# Patient Record
Sex: Male | Born: 1960 | Race: White | Hispanic: No | Marital: Married | Smoking: Current every day smoker
Health system: Southern US, Community
[De-identification: ages and names within clinical notes are randomized; demographics above are authoritative.]

## PROBLEM LIST (undated history)

## (undated) DIAGNOSIS — I10 Essential (primary) hypertension: Secondary | ICD-10-CM

## (undated) DIAGNOSIS — Z720 Tobacco use: Secondary | ICD-10-CM

## (undated) DIAGNOSIS — F1010 Alcohol abuse, uncomplicated: Secondary | ICD-10-CM

## (undated) DIAGNOSIS — D649 Anemia, unspecified: Secondary | ICD-10-CM

## (undated) HISTORY — PX: WRIST SURGERY: SHX841

---
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
## 2011-05-25 ENCOUNTER — Ambulatory Visit: Payer: Self-pay

## 2014-08-09 ENCOUNTER — Ambulatory Visit: Payer: Self-pay | Admitting: Family Medicine

## 2017-06-03 ENCOUNTER — Other Ambulatory Visit: Payer: Self-pay | Admitting: Orthopedic Surgery

## 2017-06-03 DIAGNOSIS — M5030 Other cervical disc degeneration, unspecified cervical region: Secondary | ICD-10-CM

## 2017-06-03 DIAGNOSIS — M542 Cervicalgia: Secondary | ICD-10-CM

## 2017-06-03 DIAGNOSIS — M5412 Radiculopathy, cervical region: Secondary | ICD-10-CM

## 2017-06-03 DIAGNOSIS — M4312 Spondylolisthesis, cervical region: Secondary | ICD-10-CM

## 2017-06-13 ENCOUNTER — Ambulatory Visit
Admission: RE | Admit: 2017-06-13 | Discharge: 2017-06-13 | Disposition: A | Discharge status: Home / Self Care | Payer: PRIVATE HEALTH INSURANCE | Source: Ambulatory Visit | Attending: Orthopedic Surgery | Admitting: Orthopedic Surgery

## 2017-06-13 DIAGNOSIS — M542 Cervicalgia: Secondary | ICD-10-CM

## 2017-06-13 DIAGNOSIS — M5030 Other cervical disc degeneration, unspecified cervical region: Secondary | ICD-10-CM

## 2017-06-13 DIAGNOSIS — M5412 Radiculopathy, cervical region: Secondary | ICD-10-CM

## 2017-06-13 DIAGNOSIS — M4312 Spondylolisthesis, cervical region: Secondary | ICD-10-CM

## 2017-06-27 ENCOUNTER — Ambulatory Visit: Admission: RE | Admit: 2017-06-27 | Payer: PRIVATE HEALTH INSURANCE | Source: Ambulatory Visit

## 2017-07-04 ENCOUNTER — Ambulatory Visit
Admission: RE | Admit: 2017-07-04 | Discharge: 2017-07-04 | Disposition: A | Discharge status: Home / Self Care | Payer: PRIVATE HEALTH INSURANCE | Source: Ambulatory Visit | Attending: Orthopedic Surgery | Admitting: Orthopedic Surgery

## 2017-07-04 DIAGNOSIS — M4312 Spondylolisthesis, cervical region: Secondary | ICD-10-CM | POA: Insufficient documentation

## 2017-07-04 DIAGNOSIS — M2578 Osteophyte, vertebrae: Secondary | ICD-10-CM | POA: Insufficient documentation

## 2017-07-04 DIAGNOSIS — M50122 Cervical disc disorder at C5-C6 level with radiculopathy: Secondary | ICD-10-CM | POA: Insufficient documentation

## 2017-07-04 DIAGNOSIS — M542 Cervicalgia: Secondary | ICD-10-CM | POA: Diagnosis present

## 2017-07-04 DIAGNOSIS — M5030 Other cervical disc degeneration, unspecified cervical region: Secondary | ICD-10-CM

## 2017-07-04 DIAGNOSIS — M4802 Spinal stenosis, cervical region: Secondary | ICD-10-CM | POA: Insufficient documentation

## 2017-07-04 DIAGNOSIS — M5412 Radiculopathy, cervical region: Secondary | ICD-10-CM

## 2018-01-12 DIAGNOSIS — F1010 Alcohol abuse, uncomplicated: Secondary | ICD-10-CM

## 2018-01-12 DIAGNOSIS — Z720 Tobacco use: Secondary | ICD-10-CM

## 2018-01-12 HISTORY — DX: Tobacco use: Z72.0

## 2018-01-12 HISTORY — DX: Alcohol abuse, uncomplicated: F10.10

## 2018-01-27 ENCOUNTER — Other Ambulatory Visit: Payer: PRIVATE HEALTH INSURANCE

## 2018-01-30 ENCOUNTER — Other Ambulatory Visit: Payer: Self-pay

## 2018-01-30 ENCOUNTER — Encounter
Admission: RE | Admit: 2018-01-30 | Discharge: 2018-01-30 | Disposition: A | Discharge status: Home / Self Care | Payer: PRIVATE HEALTH INSURANCE | Source: Ambulatory Visit | Attending: Neurosurgery | Admitting: Neurosurgery

## 2018-01-30 ENCOUNTER — Inpatient Hospital Stay: Admission: RE | Admit: 2018-01-30 | Payer: PRIVATE HEALTH INSURANCE | Source: Ambulatory Visit

## 2018-01-30 DIAGNOSIS — K219 Gastro-esophageal reflux disease without esophagitis: Secondary | ICD-10-CM | POA: Diagnosis not present

## 2018-01-30 DIAGNOSIS — G5601 Carpal tunnel syndrome, right upper limb: Secondary | ICD-10-CM | POA: Diagnosis not present

## 2018-01-30 DIAGNOSIS — Z5309 Procedure and treatment not carried out because of other contraindication: Secondary | ICD-10-CM | POA: Diagnosis not present

## 2018-01-30 DIAGNOSIS — Z87891 Personal history of nicotine dependence: Secondary | ICD-10-CM | POA: Diagnosis not present

## 2018-01-30 DIAGNOSIS — E785 Hyperlipidemia, unspecified: Secondary | ICD-10-CM | POA: Diagnosis not present

## 2018-01-30 DIAGNOSIS — I10 Essential (primary) hypertension: Secondary | ICD-10-CM | POA: Diagnosis not present

## 2018-01-30 HISTORY — DX: Anemia, unspecified: D64.9

## 2018-01-30 HISTORY — DX: Essential (primary) hypertension: I10

## 2018-01-30 HISTORY — DX: Alcohol abuse, uncomplicated: F10.10

## 2018-01-30 HISTORY — DX: Tobacco use: Z72.0

## 2018-01-30 LAB — CBC
HEMATOCRIT: 46.3 % (ref 40.0–52.0)
HEMOGLOBIN: 16.2 g/dL (ref 13.0–18.0)
MCH: 34.8 pg — ABNORMAL HIGH (ref 26.0–34.0)
MCHC: 34.9 g/dL (ref 32.0–36.0)
MCV: 99.6 fL (ref 80.0–100.0)
Platelets: 190 10*3/uL (ref 150–440)
RBC: 4.65 MIL/uL (ref 4.40–5.90)
RDW: 16.1 % — ABNORMAL HIGH (ref 11.5–14.5)
WBC: 5.5 10*3/uL (ref 3.8–10.6)

## 2018-01-30 LAB — APTT: aPTT: 30 seconds (ref 24–36)

## 2018-01-30 LAB — BASIC METABOLIC PANEL
ANION GAP: 12 (ref 5–15)
BUN: 11 mg/dL (ref 6–20)
CHLORIDE: 96 mmol/L — AB (ref 101–111)
CO2: 28 mmol/L (ref 22–32)
Calcium: 9.6 mg/dL (ref 8.9–10.3)
Creatinine, Ser: 0.88 mg/dL (ref 0.61–1.24)
GFR calc non Af Amer: >60 mL/min (ref >60)
GLUCOSE: 111 mg/dL — AB (ref 65–99)
POTASSIUM: 2.9 mmol/L — AB (ref 3.5–5.1)
Sodium: 136 mmol/L (ref 135–145)

## 2018-01-30 LAB — PROTIME-INR
INR: 0.98
Prothrombin Time: 12.9 seconds (ref 11.4–15.2)

## 2018-01-30 NOTE — Pre-Procedure Instructions (Signed)
Met B results sent to Dr. Lacinda Axon for review.  Advised Dr. Lacinda Axon that potassium will be checked D.O.S. 02/02/18.

## 2018-01-30 NOTE — Patient Instructions (Signed)
Your procedure is scheduled on: Monday, April 22nd  Report to THE SECOND FLOOR OF THE MEDICAL MALL.  DO NOT STOP ON THE FIRST FLOOR.  PLEASE ARRIVE IN PRE-OP AT 6:00 AM!!  Remember: Instructions that are not followed completely may result in serious  medical risk, up to and including death, or upon the discretion of your surgeon  and anesthesiologist your surgery may need to be rescheduled.     _X__ 1. Do not eat food after midnight the night before your procedure.                 No gum chewing or hard candies.                   You may drink clear liquids up to 2 hours before you are scheduled to arrive                  for your surgery-                      DO not drink clear liquids within 2 hours of the start of your surgery.                  Clear Liquids include:  water, apple juice without pulp, clear carbohydrate                 drink such as Clearfast of Gartorade, Black Coffee or Tea (Do not add                 anything to coffee or tea).  __X__2.  On the morning of surgery brush your teeth with toothpaste and water,                     you may rinse your mouth with mouthwash if you wish.                           Do not swallow any toothpaste of mouthwash.     _X__ 3.  No Alcohol for 24 hours before or after surgery.   _X__ 4.  Do Not Smoke or use e-cigarettes For 24 Hours Prior to Your Surgery.                 Do not use any chewable tobacco products for at least 6 hours prior to                 surgery.  ____  5.  Bring all medications with you on the day of surgery if instructed.   ____  6.  Notify your doctor if there is any change in your medical condition      (cold, fever, infections).     Do not wear jewelry, make-up, hairpins, clips or nail polish. Do not wear lotions, powders, or perfumes. You may wear deodorant. Do not shave 48 hours prior to surgery. Men may shave face and neck. Do not bring valuables to the hospital.    [facility] is not responsible for any belongings or valuables.  Contacts, dentures or bridgework may not be worn into surgery. Leave your suitcase in the car. After surgery it may be brought to your room. For patients admitted to the hospital, discharge time is determined by your treatment team.   Patients discharged the day of surgery will not be allowed to drive home.   Please read over the following  fact sheets that you were given:   PREPARING FOR SURGERY  ____ Take these medicines the morning of surgery with A SIP OF WATER:    1. NONE  2.   3.   4.  5.  6.  ____ Fleet Enema (as directed)   _X___ Use CHG Soap as directed  ____ Use inhalers on the day of surgery  __X__ Stop ALL ASPIRIN PRODUCTS TODAY  _X___ Stop Anti-inflammatories TODAY                        YOU  MAY TAKE TYLENOL IF NEEDED   ____ Stop supplements until after surgery.    ____ Bring C-Pap to the hospital.   WEAR A LOOSE FITTING SHIRT SO THAT A HAND BANDAGE WILL FIT.

## 2018-01-30 NOTE — Pre-Procedure Instructions (Signed)
Patient has had several instances of increased bp with diastolic numbers running above 90. Recommended patient check his bp at home on several occasions and keep track of numbers.  If the bp remains elevated with diastolic still in the 90's or above, he should contact his medical doctor.  Also, patient seems to be an appropriate candidate for a sleep study. His wife states that he regularly stops breathing during the night and snores very loudly.

## 2018-02-02 ENCOUNTER — Ambulatory Visit: Payer: PRIVATE HEALTH INSURANCE | Admitting: Certified Registered Nurse Anesthetist

## 2018-02-02 ENCOUNTER — Encounter
Admission: RE | Disposition: A | Discharge status: Home / Self Care | Payer: Self-pay | Source: Ambulatory Visit | Attending: Neurosurgery

## 2018-02-02 ENCOUNTER — Ambulatory Visit
Admission: RE | Admit: 2018-02-02 | Discharge: 2018-02-02 | Disposition: A | Discharge status: Home / Self Care | Payer: PRIVATE HEALTH INSURANCE | Source: Ambulatory Visit | Attending: Neurosurgery | Admitting: Neurosurgery

## 2018-02-02 DIAGNOSIS — K219 Gastro-esophageal reflux disease without esophagitis: Secondary | ICD-10-CM | POA: Insufficient documentation

## 2018-02-02 DIAGNOSIS — Z5309 Procedure and treatment not carried out because of other contraindication: Secondary | ICD-10-CM | POA: Insufficient documentation

## 2018-02-02 DIAGNOSIS — G5601 Carpal tunnel syndrome, right upper limb: Secondary | ICD-10-CM | POA: Diagnosis not present

## 2018-02-02 DIAGNOSIS — E785 Hyperlipidemia, unspecified: Secondary | ICD-10-CM | POA: Insufficient documentation

## 2018-02-02 DIAGNOSIS — Z87891 Personal history of nicotine dependence: Secondary | ICD-10-CM | POA: Insufficient documentation

## 2018-02-02 DIAGNOSIS — I10 Essential (primary) hypertension: Secondary | ICD-10-CM | POA: Insufficient documentation

## 2018-02-02 LAB — POCT I-STAT 4, (NA,K, GLUC, HGB,HCT)
Glucose, Bld: 127 mg/dL — ABNORMAL HIGH (ref 65–99)
HEMATOCRIT: 47 % (ref 39.0–52.0)
HEMOGLOBIN: 16 g/dL (ref 13.0–17.0)
Potassium: 2.7 mmol/L — CL (ref 3.5–5.1)
SODIUM: 132 mmol/L — AB (ref 135–145)

## 2018-02-02 SURGERY — CARPAL TUNNEL RELEASE
Anesthesia: Choice | Laterality: Right

## 2018-02-02 MED ORDER — FAMOTIDINE 20 MG PO TABS
ORAL_TABLET | ORAL | Status: AC
  Filled 2018-02-02: qty 1

## 2018-02-02 MED ORDER — LIDOCAINE HCL (PF) 2 % IJ SOLN
INTRAMUSCULAR | Status: AC
  Filled 2018-02-02: qty 10

## 2018-02-02 MED ORDER — ONDANSETRON HCL 4 MG/2ML IJ SOLN
INTRAMUSCULAR | Status: AC
  Filled 2018-02-02: qty 2

## 2018-02-02 MED ORDER — PROPOFOL 10 MG/ML IV BOLUS
INTRAVENOUS | Status: AC
  Filled 2018-02-02: qty 20

## 2018-02-02 MED ORDER — FENTANYL CITRATE (PF) 100 MCG/2ML IJ SOLN
INTRAMUSCULAR | Status: AC
  Filled 2018-02-02: qty 2

## 2018-02-02 MED ORDER — FAMOTIDINE 20 MG PO TABS
20.0000 mg | ORAL_TABLET | Freq: Once | ORAL | Status: AC
  Administered 2018-02-02: 20 mg via ORAL

## 2018-02-02 MED ORDER — SEVOFLURANE IN SOLN
RESPIRATORY_TRACT | Status: AC
  Filled 2018-02-02: qty 250

## 2018-02-02 MED ORDER — PHENYLEPHRINE HCL 10 MG/ML IJ SOLN
INTRAMUSCULAR | Status: AC
  Filled 2018-02-02: qty 1

## 2018-02-02 MED ORDER — DEXAMETHASONE SODIUM PHOSPHATE 10 MG/ML IJ SOLN
INTRAMUSCULAR | Status: AC
  Filled 2018-02-02: qty 1

## 2018-02-02 MED ORDER — EPHEDRINE SULFATE 50 MG/ML IJ SOLN
INTRAMUSCULAR | Status: AC
  Filled 2018-02-02: qty 1

## 2018-02-02 MED ORDER — LACTATED RINGERS IV SOLN
INTRAVENOUS | Status: DC
  Administered 2018-02-02: 07:00:00 via INTRAVENOUS

## 2018-02-02 MED ORDER — CEFAZOLIN SODIUM-DEXTROSE 2-3 GM-%(50ML) IV SOLR
INTRAVENOUS | Status: AC
  Filled 2018-02-02: qty 50

## 2018-02-02 MED ORDER — MIDAZOLAM HCL 2 MG/2ML IJ SOLN
INTRAMUSCULAR | Status: AC
  Filled 2018-02-02: qty 2

## 2018-02-02 MED ORDER — FAMOTIDINE 20 MG PO TABS
ORAL_TABLET | ORAL | Status: AC
  Administered 2018-02-02: 20 mg via ORAL
  Filled 2018-02-02: qty 1

## 2018-02-02 MED ORDER — DEXTROSE 5 % IV SOLN: 2.0000 g | INTRAVENOUS | Status: DC

## 2018-02-02 SURGICAL SUPPLY — 28 items
BNDG GAUZE 4.5X4.1 6PLY STRL (MISCELLANEOUS) ×3 IMPLANT
CANISTER SUCT 1200ML W/VALVE (MISCELLANEOUS) ×3 IMPLANT
CHLORAPREP W/TINT 26ML (MISCELLANEOUS) ×6 IMPLANT
CORD BIP STRL DISP 12FT (MISCELLANEOUS) ×3 IMPLANT
DERMABOND ADVANCED (GAUZE/BANDAGES/DRESSINGS)
DERMABOND ADVANCED .7 DNX12 (GAUZE/BANDAGES/DRESSINGS) IMPLANT
ELECT CAUTERY BLADE TIP 2.5 (TIP) ×3
ELECTRODE CAUTERY BLDE TIP 2.5 (TIP) ×1 IMPLANT
FORCEPS JEWEL BIP 4-3/4 STR (INSTRUMENTS) ×3 IMPLANT
GAUZE PETRO XEROFOAM 1X8 (MISCELLANEOUS) ×3 IMPLANT
GAUZE SPONGE 4X4 12PLY STRL (GAUZE/BANDAGES/DRESSINGS) ×6 IMPLANT
GLOVE BIOGEL PI IND STRL 8 (GLOVE) ×1 IMPLANT
GLOVE BIOGEL PI INDICATOR 8 (GLOVE) ×2
GLOVE SURG SYN 8.0 (GLOVE) ×3 IMPLANT
GOWN STRL REUS W/ TWL LRG LVL3 (GOWN DISPOSABLE) ×2 IMPLANT
GOWN STRL REUS W/ TWL XL LVL3 (GOWN DISPOSABLE) ×1 IMPLANT
GOWN STRL REUS W/TWL LRG LVL3 (GOWN DISPOSABLE) ×4
GOWN STRL REUS W/TWL XL LVL3 (GOWN DISPOSABLE) ×2
KIT TURNOVER KIT A (KITS) ×3 IMPLANT
NS IRRIG 1000ML POUR BTL (IV SOLUTION) ×3 IMPLANT
PACK EXTREMITY ARMC (MISCELLANEOUS) ×3 IMPLANT
STOCKINETTE STRL 4IN 9604848 (GAUZE/BANDAGES/DRESSINGS) ×3 IMPLANT
SUT ETHILON 3-0 FS-10 30 BLK (SUTURE) ×3
SUT VIC AB 2-0 SH 27 (SUTURE) ×6
SUT VIC AB 2-0 SH 27XBRD (SUTURE) ×3 IMPLANT
SUT VIC AB 3-0 SH 27 (SUTURE) ×2
SUT VIC AB 3-0 SH 27X BRD (SUTURE) ×1 IMPLANT
SUTURE EHLN 3-0 FS-10 30 BLK (SUTURE) ×1 IMPLANT

## 2018-02-02 NOTE — OR Nursing (Signed)
Surgery cancelled due to potassium of 2.7. Dr Adriana Simasook and anesthesia aware. Patient to follow up with primary care doctor today.

## 2018-02-02 NOTE — H&P (Signed)
History & Physical   Date of Service: 09/09/2016     Time: 12:32 PM  Chief Complaint: Right hand numbness  History of Present Illness: Mr. Jack Nicholson is here for evaluation of ongoing neck and right shoulder pain. He states this is been going on for over a year. It is constant, aching pain appears more in the musculature on the right side of the neck and into the lateral shoulder. He has had steroid injections in the past that only gave a day to week of relief. He has been tried on prednisone before. He has not been on gabapentin. He does not note any weakness or numbness in his upper arm. He does not note any similar symptoms in the left arm. He has not attempted physical therapy or traction.  He additionally is dealing with some for hand numbness on the right. He does not note any pain but has noted some decreased grip strength on that side. He had a nerve conduction study that did show carpal tunnel syndrome bilaterally. He was given a wrist splint which he is wearing on the right. He has been taking ibuprofen.     Past Medical History:  Diagnosis Date  . GERD (gastroesophageal reflux disease)   . Hyperlipidemia   . Hypertension    Past Surgical History:  Procedure Laterality Date  . cyst removal from spine     "about 50 years ago"  . Hardware removal, excision of osteophyte and debridement of gouty tophus, right index dip joint  Right 07/03/2016   Dr.Poggi   . KNEE ARTHROSCOPY Right 01/2012  . Pin put in right hand index finger     Family History  Problem Relation Age of Onset  . No Known Problems Mother   . No Known Problems Father    Social History   Social History  . Marital status: Single    Spouse name: N/A  . Number of children: N/A  . Years of education: N/A   Social History Main Topics  . Smoking status: Former Smoker    Quit date: 01/12/1955  . Smokeless tobacco: Never Used  . Alcohol use 0.0 oz/week  . Drug use: No  . Sexual activity: Not Asked   Other  Topics Concern  . None   Social History Narrative  . None     No Known Allergies  Medications: Cannot display prior to admission medications because the patient has not been admitted in this contact.    Review of Systems:  General ROS: Negative Psychological ROS: Negative Ophthalmic ROS: Negative ENT ROS: Negative Hematological and Lymphatic ROS: Negative  Endocrine ROS: Negative Respiratory ROS: Negative Cardiovascular ROS: Negative Gastrointestinal ROS: Negative Genito-Urinary ROS: Negative Musculoskeletal ROS: Positive for neck pain Neurological ROS: Positive for right hand numbness Dermatological ROS: Negative   Physical Exam: Temp:  [96.6 F (35.9 C)] 96.6 F (35.9 C) (04/22 0627) Pulse Rate:  [77] 77 (04/22 0627) Resp:  [16] 16 (04/22 0627) BP: (149)/(96) 149/96 (04/22 0627) SpO2:  [99 %] 99 % (04/22 0627) Temp (24hrs), Avg:96.6 F (35.9 C), Min:96.6 F (35.9 C), Max:96.6 F (35.9 C)  Weight: 62.2 kg (137 lb 3.2 oz)  General appearance: Alert, cooperative, in no acute distress Head: Normocephalic, atraumatic Eyes: Normal, EOM intact Oropharynx: Moist without lesions Neck: Neck pain elicited on range of motion exercises, tenderness to palpation of the right paraspinal musculature Pulm: Normal effort, no wheezing CV: Regular rate and rhythm Ext: No edema in LE bilaterally, warm extremities  Neurologic exam:  Mental  status: alertness: alert, affect: normal Speech: fluent and clear Motor: Some giveaway strength noticed in the right upper extremities in all myotomes. At full effort, he is 5 out of 5 strength in bilateral deltoid, tricep, bicep, wrist extension, grip, interossei, APB He is 5 out of 5 strength in bilateral lower extremities Sensory: Decreased to light touch over all fingers of the right hand and palm Reflexes: 2+ and symmetric bilaterally for biceps, 1+ at bilateral patella Gait: normal    Data: EMG This is an abnormal electrodiagnostic  exam consistent with 1)  bilateral moderate (grade III) carpal tunnel syndrome (median  nerve entrapment at wrist). 2) Bilateral sensory ulnar  neuropathies (can't rule out component of generalized peripheral  neuropathy as patient has history of significant alcohol use).  3) Chronic right C5 radiculopathy.    Assessment & Plan:  Plan for right carpal tunnel release  VTE Prophylaxis: SCD  Code Status: Full Code  Discharge Planning: Home  Nathaniel Man, MD 09/09/2016

## 2018-02-02 NOTE — Interval H&P Note (Signed)
History and Physical Interval Note:  02/02/2018 6:40 AM  Tor NettersWalter K Deakins  has presented today for surgery, with the diagnosis of RIGHT CARPAL TUNNEL SYNDROME  The various methods of treatment have been discussed with the patient and family. After consideration of risks, benefits and other options for treatment, the patient has consented to  Procedure(s): CARPAL TUNNEL RELEASE (Right) as a surgical intervention .  The patient's history has been reviewed, patient examined, no change in status, stable for surgery.  I have reviewed the patient's chart and labs.  Questions were answered to the patient's satisfaction.     Lucy ChrisSteven Taym Twist

## 2018-02-02 NOTE — Progress Notes (Signed)
Ancef 2 grams x1 ordered per consult for surgical prophylaxis.

## 2018-02-13 ENCOUNTER — Ambulatory Visit
Admission: EM | Admit: 2018-02-13 | Discharge: 2018-02-13 | Disposition: A | Discharge status: Home / Self Care | Payer: PRIVATE HEALTH INSURANCE | Attending: Emergency Medicine | Admitting: Emergency Medicine

## 2018-02-13 ENCOUNTER — Encounter: Payer: Self-pay | Admitting: Gynecology

## 2018-02-13 DIAGNOSIS — T783XXA Angioneurotic edema, initial encounter: Secondary | ICD-10-CM | POA: Diagnosis not present

## 2018-02-13 MED ORDER — LORATADINE 10 MG PO TABS: 10.0000 mg | ORAL_TABLET | Freq: Every day | ORAL | 0 refills | Status: DC

## 2018-02-13 MED ORDER — EPINEPHRINE 0.3 MG/0.3ML IJ SOAJ: 0.3000 mg | Freq: Once | INTRAMUSCULAR | 1 refills | Status: AC

## 2018-02-13 MED ORDER — PREDNISONE 10 MG (21) PO TBPK: ORAL_TABLET | ORAL | 0 refills | Status: DC

## 2018-02-13 MED ORDER — DEXAMETHASONE SODIUM PHOSPHATE 10 MG/ML IJ SOLN
10.0000 mg | Freq: Once | INTRAMUSCULAR | Status: AC
  Administered 2018-02-13: 10 mg via INTRAMUSCULAR

## 2018-02-13 MED ORDER — FAMOTIDINE 20 MG PO TABS: 20.0000 mg | ORAL_TABLET | Freq: Two times a day (BID) | ORAL | 0 refills | Status: DC

## 2018-02-13 MED ORDER — DIPHENHYDRAMINE HCL 50 MG PO CAPS
50.0000 mg | ORAL_CAPSULE | Freq: Once | ORAL | Status: AC
  Administered 2018-02-13: 50 mg via ORAL

## 2018-02-13 NOTE — Discharge Instructions (Signed)
If your symptoms return or get worse, use the EpiPen, take 50 mg of Benadryl, go immediately to the ER.  Talk to your doctor about the lip swelling.  This could be from your lisinopril.

## 2018-02-13 NOTE — ED Triage Notes (Signed)
Per patient had surgery on his right wrist x yesterday at [facility]. Per patient return home at 10:30 am. Patient stated that after getting home he had a couple of cigarettes then suddenly his lips and eyes swollen .

## 2018-02-13 NOTE — ED Provider Notes (Signed)
HPI  SUBJECTIVE:  Jack Nicholson is a 57 y.o. male who presents with bilateral lip swelling starting last night.  Patient states that he had general anesthesia for surgery yesterday but recovered uneventfully.  States that he took some Benadryl and applied ice with improvement in his lower lip swelling.  States that his right eye was swollen this morning but that this has also resolved.  No sensation of his throat swelling shut, difficulty breathing, tongue swelling, wheezing, shortness of breath, abdominal pain, diarrhea, presyncope, syncope, urticaria, rash.  No other new lotions, soaps, detergents, medications.  He is on lisinopril for his high blood pressure but states that ,he has not taken it in 2 days because of the surgery.  Patient is also a smoker.  This is never happened before.  JXB:JYNWG, Arlyss Repress, NP   Past Medical History:  Diagnosis Date  . Alcohol abuse, daily use 01/2018   drinks several drinks of beer/hard liquor every day. has tremors and takes vitamin b12 for this  . Anemia    vitamin b12 deficiency  . Hypertension   . Tobacco abuse 01/2018   smokes at least 1 ppd. has heavy smokers' cough    Past Surgical History:  Procedure Laterality Date  . TONSILLECTOMY  1970  . WRIST SURGERY      History reviewed. No pertinent family history.  Social History   Tobacco Use  . Smoking status: Current Every Day Smoker    Packs/day: 1.00    Years: 40.00    Pack years: 40.00    Types: Cigarettes  . Smokeless tobacco: Never Used  Substance Use Topics  . Alcohol use: Yes    Alcohol/week: 2.4 - 4.8 oz    Types: 2 - 4 Cans of beer, 2 - 4 Shots of liquor per week    Comment: drinks several drinks a night  . Drug use: Yes    Types: Marijuana    No current facility-administered medications for this encounter.   Current Outpatient Medications:  .  CREAM BASE EX, Apply 1 application topically 4 (four) times daily as needed (FOR PAIN.). CBD LOTION APPLIED TO  SHOULDER FOR PAIN., Disp: , Rfl:  .  lisinopril (PRINIVIL,ZESTRIL) 20 MG tablet, Take 20 mg by mouth daily., Disp: , Rfl:  .  vitamin B-12 (CYANOCOBALAMIN) 1000 MCG tablet, Take 1,000 mcg by mouth daily., Disp: , Rfl:  .  famotidine (PEPCID) 20 MG tablet, Take 1 tablet (20 mg total) by mouth 2 (two) times daily., Disp: 10 tablet, Rfl: 0 .  loratadine (CLARITIN) 10 MG tablet, Take 1 tablet (10 mg total) by mouth daily., Disp: 10 tablet, Rfl: 0 .  predniSONE (STERAPRED UNI-PAK 21 TAB) 10 MG (21) TBPK tablet, Dispense one 6 day pack. Take as directed with food., Disp: 21 tablet, Rfl: 0  No Known Allergies   ROS  As noted in HPI.   Physical Exam  BP 129/90 (BP Location: Left Arm)   Pulse (!) 116   Temp 98.8 F (37.1 C) (Oral)   Resp 16   Wt 138 lb (62.6 kg)   SpO2 100%   BMI 20.38 kg/m   Constitutional: Well developed, well nourished, no acute distress Eyes:  EOMI, conjunctiva normal bilaterally HENT: Normocephalic, atraumatic,mucus membranes moist.  No appreciable facial swelling.  Mild swelling of his upper lip.  Lower lip normal.  No swelling of the tongue.  Airway widely patent.  See picture    Respiratory: Normal inspiratory effort, lungs clear bilaterally, good air movement  Cardiovascular: Regular tachycardia, no murmurs, rubs, gallops GI: nondistended skin: No rash, skin intact Musculoskeletal: no deformities Neurologic: Alert & oriented x 3, no focal neuro deficits Psychiatric: Speech and behavior appropriate   ED Course   Medications  dexamethasone (DECADRON) injection 10 mg (10 mg Intramuscular Given 02/13/18 1117)  diphenhydrAMINE (BENADRYL) capsule 50 mg (50 mg Oral Given 02/13/18 1116)    No orders of the defined types were placed in this encounter.   No results found for this or any previous visit (from the past 24 hour(s)). No results found.  ED Clinical Impression  Angioedema, initial encounter   ED Assessment/Plan  Patient has mild angioedema of  his upper lip.  His airway is patent.  No Evidence of anaphylaxis. suspect allergic reaction due to 1 of the medicines that he got yesterday during surgery.  Although this could be ACE inhibitor angioedema, he has not taken his ACE inhibitor in 2 days so think that this is less likely.  Giving 10 mg dexamethasone IM x1 and Benadryl 50 mg p.o. X1, applying ice pack.  On Reevaluation, no change, but patient is not getting worse.  Home with a 6-day prednisone taper, Claritin or Zyrtec, Pepcid and EpiPen.  Continue ice pack to his upper lip. discussed with them that they will need to follow-up with his doctor in several days to discuss possibly discontinuing the ACE inhibitor.  Discussed with patient that this could be due to the ACE inhibitor.  If symptoms return or he gets worse, he is to use his EpiPen, take 50 mg of Benadryl and go immediately to the emergency department.   Discussed MDM, treatment plan, and plan for follow-up with patient. Discussed sn/sx that should prompt return to the ED. patient agrees with plan.   Meds ordered this encounter  Medications  . dexamethasone (DECADRON) injection 10 mg  . diphenhydrAMINE (BENADRYL) capsule 50 mg  . famotidine (PEPCID) 20 MG tablet    Sig: Take 1 tablet (20 mg total) by mouth 2 (two) times daily.    Dispense:  10 tablet    Refill:  0  . EPINEPHrine 0.3 mg/0.3 mL IJ SOAJ injection    Sig: Inject 0.3 mLs (0.3 mg total) into the muscle once for 1 dose.    Dispense:  1 Device    Refill:  1  . loratadine (CLARITIN) 10 MG tablet    Sig: Take 1 tablet (10 mg total) by mouth daily.    Dispense:  10 tablet    Refill:  0  . predniSONE (STERAPRED UNI-PAK 21 TAB) 10 MG (21) TBPK tablet    Sig: Dispense one 6 day pack. Take as directed with food.    Dispense:  21 tablet    Refill:  0    *This clinic note was created using Scientist, clinical (histocompatibility and immunogenetics). Therefore, there may be occasional mistakes despite careful proofreading.   ?   Domenick Gong,  MD 02/15/18 [phone]

## 2018-06-17 ENCOUNTER — Inpatient Hospital Stay
Admission: EM | Admit: 2018-06-17 | Discharge: 2018-06-25 | DRG: 915 | Disposition: A | Discharge status: Skilled Nursing Facility | Payer: PRIVATE HEALTH INSURANCE | Attending: Specialist | Admitting: Specialist

## 2018-06-17 ENCOUNTER — Encounter
Admission: EM | Disposition: A | Discharge status: Skilled Nursing Facility | Payer: Self-pay | Source: Home / Self Care | Attending: Otolaryngology

## 2018-06-17 ENCOUNTER — Emergency Department: Payer: PRIVATE HEALTH INSURANCE | Admitting: Anesthesiology

## 2018-06-17 ENCOUNTER — Inpatient Hospital Stay: Payer: PRIVATE HEALTH INSURANCE

## 2018-06-17 ENCOUNTER — Other Ambulatory Visit: Payer: Self-pay

## 2018-06-17 DIAGNOSIS — J9811 Atelectasis: Secondary | ICD-10-CM | POA: Diagnosis present

## 2018-06-17 DIAGNOSIS — Z79899 Other long term (current) drug therapy: Secondary | ICD-10-CM | POA: Diagnosis not present

## 2018-06-17 DIAGNOSIS — J440 Chronic obstructive pulmonary disease with acute lower respiratory infection: Secondary | ICD-10-CM | POA: Diagnosis present

## 2018-06-17 DIAGNOSIS — X58XXXA Exposure to other specified factors, initial encounter: Secondary | ICD-10-CM | POA: Diagnosis not present

## 2018-06-17 DIAGNOSIS — Z681 Body mass index (BMI) 19 or less, adult: Secondary | ICD-10-CM

## 2018-06-17 DIAGNOSIS — F10231 Alcohol dependence with withdrawal delirium: Secondary | ICD-10-CM | POA: Diagnosis present

## 2018-06-17 DIAGNOSIS — I10 Essential (primary) hypertension: Secondary | ICD-10-CM | POA: Diagnosis present

## 2018-06-17 DIAGNOSIS — E538 Deficiency of other specified B group vitamins: Secondary | ICD-10-CM | POA: Diagnosis present

## 2018-06-17 DIAGNOSIS — E869 Volume depletion, unspecified: Secondary | ICD-10-CM | POA: Diagnosis present

## 2018-06-17 DIAGNOSIS — J156 Pneumonia due to other aerobic Gram-negative bacteria: Secondary | ICD-10-CM | POA: Diagnosis present

## 2018-06-17 DIAGNOSIS — J9600 Acute respiratory failure, unspecified whether with hypoxia or hypercapnia: Secondary | ICD-10-CM

## 2018-06-17 DIAGNOSIS — F17210 Nicotine dependence, cigarettes, uncomplicated: Secondary | ICD-10-CM | POA: Diagnosis present

## 2018-06-17 DIAGNOSIS — T464X5A Adverse effect of angiotensin-converting-enzyme inhibitors, initial encounter: Secondary | ICD-10-CM | POA: Diagnosis present

## 2018-06-17 DIAGNOSIS — E876 Hypokalemia: Secondary | ICD-10-CM | POA: Diagnosis present

## 2018-06-17 DIAGNOSIS — Z0189 Encounter for other specified special examinations: Secondary | ICD-10-CM

## 2018-06-17 DIAGNOSIS — E872 Acidosis: Secondary | ICD-10-CM | POA: Diagnosis present

## 2018-06-17 DIAGNOSIS — N179 Acute kidney failure, unspecified: Secondary | ICD-10-CM

## 2018-06-17 DIAGNOSIS — T783XXA Angioneurotic edema, initial encounter: Secondary | ICD-10-CM | POA: Diagnosis present

## 2018-06-17 DIAGNOSIS — D649 Anemia, unspecified: Secondary | ICD-10-CM | POA: Diagnosis present

## 2018-06-17 DIAGNOSIS — Z888 Allergy status to other drugs, medicaments and biological substances status: Secondary | ICD-10-CM | POA: Diagnosis not present

## 2018-06-17 DIAGNOSIS — J9601 Acute respiratory failure with hypoxia: Secondary | ICD-10-CM | POA: Diagnosis present

## 2018-06-17 DIAGNOSIS — E871 Hypo-osmolality and hyponatremia: Secondary | ICD-10-CM | POA: Diagnosis present

## 2018-06-17 DIAGNOSIS — E43 Unspecified severe protein-calorie malnutrition: Secondary | ICD-10-CM

## 2018-06-17 DIAGNOSIS — I959 Hypotension, unspecified: Secondary | ICD-10-CM | POA: Diagnosis present

## 2018-06-17 DIAGNOSIS — J189 Pneumonia, unspecified organism: Secondary | ICD-10-CM

## 2018-06-17 HISTORY — PX: INTUBATION-ENDOTRACHEAL WITH TRACHEOSTOMY STANDBY: SHX6592

## 2018-06-17 LAB — CBC WITH DIFFERENTIAL/PLATELET
BASOS ABS: 0 10*3/uL (ref 0–0.1)
BASOS PCT: 0 %
EOS ABS: 0 10*3/uL (ref 0–0.7)
EOS PCT: 0 %
HCT: 42.9 % (ref 40.0–52.0)
HEMOGLOBIN: 14.8 g/dL (ref 13.0–18.0)
Lymphocytes Relative: 3 %
Lymphs Abs: 0.3 10*3/uL — ABNORMAL LOW (ref 1.0–3.6)
MCH: 34 pg (ref 26.0–34.0)
MCHC: 34.4 g/dL (ref 32.0–36.0)
MCV: 98.8 fL (ref 80.0–100.0)
Monocytes Absolute: 0.1 10*3/uL — ABNORMAL LOW (ref 0.2–1.0)
Monocytes Relative: 1 %
NEUTROS PCT: 96 %
Neutro Abs: 11.3 10*3/uL — ABNORMAL HIGH (ref 1.4–6.5)
PLATELETS: 212 10*3/uL (ref 150–440)
RBC: 4.34 MIL/uL — AB (ref 4.40–5.90)
RDW: 14.4 % (ref 11.5–14.5)
WBC: 11.8 10*3/uL — AB (ref 3.8–10.6)

## 2018-06-17 LAB — COMPREHENSIVE METABOLIC PANEL
ALK PHOS: 54 U/L (ref 38–126)
ALT: 16 U/L (ref 0–44)
AST: 32 U/L (ref 15–41)
Albumin: 3.9 g/dL (ref 3.5–5.0)
Anion gap: 13 (ref 5–15)
BUN: 25 mg/dL — ABNORMAL HIGH (ref 6–20)
CALCIUM: 8.1 mg/dL — AB (ref 8.9–10.3)
CO2: 21 mmol/L — ABNORMAL LOW (ref 22–32)
Chloride: 91 mmol/L — ABNORMAL LOW (ref 98–111)
Creatinine, Ser: 1.28 mg/dL — ABNORMAL HIGH (ref 0.61–1.24)
GFR calc Af Amer: >60 mL/min (ref >60)
GFR calc non Af Amer: >60 mL/min (ref >60)
Glucose, Bld: 155 mg/dL — ABNORMAL HIGH (ref 70–99)
POTASSIUM: 3.6 mmol/L (ref 3.5–5.1)
SODIUM: 125 mmol/L — AB (ref 135–145)
TOTAL PROTEIN: 7.4 g/dL (ref 6.5–8.1)
Total Bilirubin: 1.2 mg/dL (ref 0.3–1.2)

## 2018-06-17 LAB — BLOOD GAS, ARTERIAL
ACID-BASE DEFICIT: 5.2 mmol/L — AB (ref 0.0–2.0)
BICARBONATE: 22 mmol/L (ref 20.0–28.0)
FIO2: 0.5
MECHVT: 500 mL
O2 SAT: 94.8 %
PATIENT TEMPERATURE: 37
PCO2 ART: 48 mmHg (ref 32.0–48.0)
PEEP/CPAP: 5 cmH2O
PH ART: 7.27 — AB (ref 7.350–7.450)
PO2 ART: 85 mmHg (ref 83.0–108.0)
RATE: 14 resp/min

## 2018-06-17 LAB — TRIGLYCERIDES: Triglycerides: 125 mg/dL (ref <150)

## 2018-06-17 LAB — URINE DRUG SCREEN, QUALITATIVE (ARMC ONLY)
AMPHETAMINES, UR SCREEN: NOT DETECTED
Barbiturates, Ur Screen: NOT DETECTED
Cannabinoid 50 Ng, Ur ~~LOC~~: NOT DETECTED
Cocaine Metabolite,Ur ~~LOC~~: NOT DETECTED
MDMA (Ecstasy)Ur Screen: NOT DETECTED
METHADONE SCREEN, URINE: NOT DETECTED
OPIATE, UR SCREEN: NOT DETECTED
Phencyclidine (PCP) Ur S: NOT DETECTED
Tricyclic, Ur Screen: NOT DETECTED

## 2018-06-17 LAB — GLUCOSE, CAPILLARY
GLUCOSE-CAPILLARY: 158 mg/dL — AB (ref 70–99)
Glucose-Capillary: 114 mg/dL — ABNORMAL HIGH (ref 70–99)

## 2018-06-17 LAB — MRSA PCR SCREENING: MRSA by PCR: POSITIVE — AB

## 2018-06-17 LAB — PHOSPHORUS: PHOSPHORUS: 5 mg/dL — AB (ref 2.5–4.6)

## 2018-06-17 LAB — MAGNESIUM: MAGNESIUM: 1.2 mg/dL — AB (ref 1.7–2.4)

## 2018-06-17 LAB — OSMOLALITY: Osmolality: 272 mOsm/kg — ABNORMAL LOW (ref 275–295)

## 2018-06-17 LAB — PROTIME-INR
INR: 1.07
Prothrombin Time: 13.8 seconds (ref 11.4–15.2)

## 2018-06-17 LAB — LACTIC ACID, PLASMA: Lactic Acid, Venous: 1.3 mmol/L (ref 0.5–1.9)

## 2018-06-17 LAB — APTT: aPTT: 34 seconds (ref 24–36)

## 2018-06-17 LAB — SODIUM, URINE, RANDOM: SODIUM UR: 39 mmol/L

## 2018-06-17 SURGERY — INTUBATION-ENDOTRACHEAL WITH TRACHEOSTOMY STANDBY
Anesthesia: General

## 2018-06-17 MED ORDER — MIDAZOLAM HCL 5 MG/5ML IJ SOLN
INTRAMUSCULAR | Status: DC | PRN
  Administered 2018-06-17: 1 mg via INTRAVENOUS
  Administered 2018-06-17: 2 mg via INTRAVENOUS
  Administered 2018-06-17: 1 mg via INTRAVENOUS

## 2018-06-17 MED ORDER — ROCURONIUM BROMIDE 100 MG/10ML IV SOLN
INTRAVENOUS | Status: DC | PRN
  Administered 2018-06-17: 50 mg via INTRAVENOUS

## 2018-06-17 MED ORDER — POTASSIUM CHLORIDE 20 MEQ/15ML (10%) PO SOLN
20.0000 meq | Freq: Once | ORAL | Status: AC
  Administered 2018-06-17: 20 meq
  Filled 2018-06-17: qty 15

## 2018-06-17 MED ORDER — PROPOFOL 10 MG/ML IV BOLUS
INTRAVENOUS | Status: DC | PRN
  Administered 2018-06-17: 150 mg via INTRAVENOUS

## 2018-06-17 MED ORDER — FENTANYL CITRATE (PF) 100 MCG/2ML IJ SOLN
INTRAMUSCULAR | Status: DC | PRN
  Administered 2018-06-17 (×4): 50 ug via INTRAVENOUS

## 2018-06-17 MED ORDER — ASPIRIN 300 MG RE SUPP
300.0000 mg | RECTAL | Status: AC
  Administered 2018-06-17: 300 mg via RECTAL
  Filled 2018-06-17: qty 1

## 2018-06-17 MED ORDER — DIPHENHYDRAMINE HCL 50 MG/ML IJ SOLN
25.0000 mg | Freq: Three times a day (TID) | INTRAMUSCULAR | Status: DC
  Administered 2018-06-17 – 2018-06-19 (×5): 25 mg via INTRAVENOUS
  Filled 2018-06-17 (×5): qty 1

## 2018-06-17 MED ORDER — DEXAMETHASONE SODIUM PHOSPHATE 10 MG/ML IJ SOLN
10.0000 mg | Freq: Once | INTRAMUSCULAR | Status: AC
  Administered 2018-06-17: 10 mg via INTRAVENOUS
  Filled 2018-06-17: qty 1

## 2018-06-17 MED ORDER — FENTANYL BOLUS VIA INFUSION
50.0000 ug | INTRAVENOUS | Status: DC | PRN
  Filled 2018-06-17: qty 50

## 2018-06-17 MED ORDER — FAMOTIDINE IN NACL 20-0.9 MG/50ML-% IV SOLN
20.0000 mg | Freq: Once | INTRAVENOUS | Status: AC
  Administered 2018-06-17: 20 mg via INTRAVENOUS
  Filled 2018-06-17: qty 50

## 2018-06-17 MED ORDER — SUCCINYLCHOLINE CHLORIDE 20 MG/ML IJ SOLN
INTRAMUSCULAR | Status: AC
  Filled 2018-06-17: qty 1

## 2018-06-17 MED ORDER — MIDAZOLAM HCL 2 MG/2ML IJ SOLN
2.0000 mg | INTRAMUSCULAR | Status: DC | PRN
  Filled 2018-06-17: qty 2

## 2018-06-17 MED ORDER — OXYMETAZOLINE HCL 0.05 % NA SOLN
NASAL | Status: AC
  Filled 2018-06-17: qty 15

## 2018-06-17 MED ORDER — MIDAZOLAM HCL 2 MG/2ML IJ SOLN
INTRAMUSCULAR | Status: AC
  Filled 2018-06-17: qty 2

## 2018-06-17 MED ORDER — THIAMINE HCL 100 MG/ML IJ SOLN
100.0000 mg | INTRAMUSCULAR | Status: DC
  Administered 2018-06-17 – 2018-06-21 (×5): 100 mg via INTRAVENOUS
  Filled 2018-06-17 (×6): qty 2

## 2018-06-17 MED ORDER — FENTANYL CITRATE (PF) 100 MCG/2ML IJ SOLN
INTRAMUSCULAR | Status: AC
  Filled 2018-06-17: qty 2

## 2018-06-17 MED ORDER — INSULIN ASPART 100 UNIT/ML ~~LOC~~ SOLN
0.0000 [IU] | SUBCUTANEOUS | Status: DC
  Administered 2018-06-17: 2 [IU] via SUBCUTANEOUS
  Administered 2018-06-18 (×2): 1 [IU] via SUBCUTANEOUS
  Administered 2018-06-18: 2 [IU] via SUBCUTANEOUS
  Administered 2018-06-18: 1 [IU] via SUBCUTANEOUS
  Administered 2018-06-19 (×3): 2 [IU] via SUBCUTANEOUS
  Administered 2018-06-19 – 2018-06-20 (×4): 1 [IU] via SUBCUTANEOUS
  Filled 2018-06-17 (×12): qty 1

## 2018-06-17 MED ORDER — SODIUM CHLORIDE 0.9 % IV BOLUS: 1000.0000 mL | Freq: Once | INTRAVENOUS | Status: DC

## 2018-06-17 MED ORDER — MAGNESIUM SULFATE 2 GM/50ML IV SOLN
2.0000 g | Freq: Once | INTRAVENOUS | Status: AC
  Administered 2018-06-17: 2 g via INTRAVENOUS
  Filled 2018-06-17: qty 50

## 2018-06-17 MED ORDER — PROPOFOL 1000 MG/100ML IV EMUL
0.0000 ug/kg/min | INTRAVENOUS | Status: DC
  Administered 2018-06-17: 47.0866 ug/kg/min via INTRAVENOUS
  Administered 2018-06-18 – 2018-06-20 (×10): 50 ug/kg/min via INTRAVENOUS
  Filled 2018-06-17 (×11): qty 100

## 2018-06-17 MED ORDER — MIDAZOLAM HCL 2 MG/2ML IJ SOLN
2.0000 mg | INTRAMUSCULAR | Status: DC | PRN
  Administered 2018-06-17 – 2018-06-18 (×2): 2 mg via INTRAVENOUS
  Filled 2018-06-17: qty 2

## 2018-06-17 MED ORDER — SODIUM CHLORIDE 0.9 % IV SOLN
INTRAVENOUS | Status: DC
  Administered 2018-06-17 – 2018-06-19 (×4): via INTRAVENOUS

## 2018-06-17 MED ORDER — METOPROLOL TARTRATE 5 MG/5ML IV SOLN
INTRAVENOUS | Status: DC | PRN
  Administered 2018-06-17: 4 mg via INTRAVENOUS

## 2018-06-17 MED ORDER — LIDOCAINE HCL URETHRAL/MUCOSAL 2 % EX GEL
CUTANEOUS | Status: AC
  Filled 2018-06-17: qty 10

## 2018-06-17 MED ORDER — MUPIROCIN 2 % EX OINT
1.0000 "application " | TOPICAL_OINTMENT | Freq: Two times a day (BID) | CUTANEOUS | Status: AC
  Administered 2018-06-17 – 2018-06-22 (×10): 1 via NASAL
  Filled 2018-06-17: qty 22

## 2018-06-17 MED ORDER — FENTANYL CITRATE (PF) 100 MCG/2ML IJ SOLN: 50.0000 ug | Freq: Once | INTRAMUSCULAR | Status: DC

## 2018-06-17 MED ORDER — FENTANYL 2500MCG IN NS 250ML (10MCG/ML) PREMIX INFUSION
25.0000 ug/h | INTRAVENOUS | Status: DC
  Administered 2018-06-17: 300 ug/h via INTRAVENOUS
  Administered 2018-06-18 – 2018-06-20 (×6): 250 ug/h via INTRAVENOUS
  Filled 2018-06-17 (×6): qty 250

## 2018-06-17 MED ORDER — SODIUM CHLORIDE 0.9 % IV SOLN
250.0000 mL | INTRAVENOUS | Status: DC | PRN
  Administered 2018-06-17 – 2018-06-20 (×2): 250 mL via INTRAVENOUS

## 2018-06-17 MED ORDER — ORAL CARE MOUTH RINSE
15.0000 mL | OROMUCOSAL | Status: DC
  Administered 2018-06-17 – 2018-06-20 (×25): 15 mL via OROMUCOSAL

## 2018-06-17 MED ORDER — FAMOTIDINE IN NACL 20-0.9 MG/50ML-% IV SOLN
20.0000 mg | Freq: Two times a day (BID) | INTRAVENOUS | Status: DC
  Administered 2018-06-17: 20 mg via INTRAVENOUS
  Filled 2018-06-17: qty 50

## 2018-06-17 MED ORDER — LIDOCAINE HCL (CARDIAC) PF 100 MG/5ML IV SOSY
PREFILLED_SYRINGE | INTRAVENOUS | Status: DC | PRN
  Administered 2018-06-17: 60 mg via INTRAVENOUS

## 2018-06-17 MED ORDER — DEXAMETHASONE SODIUM PHOSPHATE 10 MG/ML IJ SOLN
INTRAMUSCULAR | Status: AC
  Filled 2018-06-17: qty 1

## 2018-06-17 MED ORDER — FOLIC ACID 5 MG/ML IJ SOLN
1.0000 mg | Freq: Every day | INTRAMUSCULAR | Status: DC
  Administered 2018-06-17 – 2018-06-22 (×6): 1 mg via INTRAVENOUS
  Filled 2018-06-17 (×7): qty 0.2

## 2018-06-17 MED ORDER — RACEPINEPHRINE HCL 2.25 % IN NEBU: 0.5000 mL | INHALATION_SOLUTION | Freq: Once | RESPIRATORY_TRACT | Status: DC

## 2018-06-17 MED ORDER — CHLORHEXIDINE GLUCONATE 0.12% ORAL RINSE (MEDLINE KIT)
15.0000 mL | Freq: Two times a day (BID) | OROMUCOSAL | Status: DC
  Administered 2018-06-17 – 2018-06-20 (×6): 15 mL via OROMUCOSAL

## 2018-06-17 MED ORDER — CHLORHEXIDINE GLUCONATE CLOTH 2 % EX PADS
6.0000 | MEDICATED_PAD | Freq: Every day | CUTANEOUS | Status: AC
  Administered 2018-06-18 – 2018-06-22 (×5): 6 via TOPICAL

## 2018-06-17 MED ORDER — SODIUM CHLORIDE 0.9 % IV SOLN
0.0000 ug/min | INTRAVENOUS | Status: DC
  Administered 2018-06-17: 20 ug/min via INTRAVENOUS
  Filled 2018-06-17: qty 1

## 2018-06-17 MED ORDER — ASPIRIN 81 MG PO CHEW: 324.0000 mg | CHEWABLE_TABLET | ORAL | Status: AC

## 2018-06-17 MED ORDER — KETAMINE HCL 50 MG/ML IJ SOLN
INTRAMUSCULAR | Status: AC
  Filled 2018-06-17: qty 10

## 2018-06-17 MED ORDER — HEPARIN SODIUM (PORCINE) 5000 UNIT/ML IJ SOLN
5000.0000 [IU] | Freq: Three times a day (TID) | INTRAMUSCULAR | Status: DC
  Administered 2018-06-17 – 2018-06-25 (×22): 5000 [IU] via SUBCUTANEOUS
  Filled 2018-06-17 (×22): qty 1

## 2018-06-17 MED ORDER — LACTATED RINGERS IV SOLN
INTRAVENOUS | Status: DC | PRN
  Administered 2018-06-17: 18:00:00 via INTRAVENOUS

## 2018-06-17 MED ORDER — ROCURONIUM BROMIDE 50 MG/5ML IV SOLN
INTRAVENOUS | Status: AC
  Filled 2018-06-17: qty 1

## 2018-06-17 MED ORDER — DEXAMETHASONE SODIUM PHOSPHATE 10 MG/ML IJ SOLN
10.0000 mg | Freq: Three times a day (TID) | INTRAMUSCULAR | Status: DC
  Administered 2018-06-17 – 2018-06-18 (×2): 10 mg via INTRAVENOUS
  Filled 2018-06-17 (×4): qty 1

## 2018-06-17 MED ORDER — PHENYLEPHRINE HCL-NACL 10-0.9 MG/250ML-% IV SOLN
0.0000 ug/min | INTRAVENOUS | Status: DC
  Filled 2018-06-17: qty 250

## 2018-06-17 MED ORDER — PROPOFOL 10 MG/ML IV BOLUS
INTRAVENOUS | Status: AC
  Filled 2018-06-17: qty 20

## 2018-06-17 MED ORDER — SUCCINYLCHOLINE CHLORIDE 20 MG/ML IJ SOLN
INTRAMUSCULAR | Status: DC | PRN
  Administered 2018-06-17: 100 mg via INTRAVENOUS

## 2018-06-17 MED ORDER — ONDANSETRON HCL 4 MG/2ML IJ SOLN
INTRAMUSCULAR | Status: AC
  Filled 2018-06-17: qty 2

## 2018-06-17 SURGICAL SUPPLY — 24 items
ATOMIZER TRACHEAL (MISCELLANEOUS) IMPLANT
BASIN GRAD PLASTIC 32OZ STRL (MISCELLANEOUS) ×3 IMPLANT
BNDG EYE OVAL (MISCELLANEOUS) IMPLANT
CUP MEDICINE 2OZ PLAST GRAD ST (MISCELLANEOUS) IMPLANT
DRAPE TABLE BACK 80X90 (DRAPES) ×3 IMPLANT
DRSG TELFA 4X3 1S NADH ST (GAUZE/BANDAGES/DRESSINGS) IMPLANT
GLOVE BIO SURGEON STRL SZ7.5 (GLOVE) ×3 IMPLANT
GOWN STRL REUS W/ TWL LRG LVL3 (GOWN DISPOSABLE) ×2 IMPLANT
GOWN STRL REUS W/TWL LRG LVL3 (GOWN DISPOSABLE) ×4
LABEL OR SOLS (LABEL) IMPLANT
MARKER SKIN DUAL TIP RULER LAB (MISCELLANEOUS) ×3 IMPLANT
NDL SAFETY ECLIPSE 18X1.5 (NEEDLE) IMPLANT
NEEDLE FILTER BLUNT 18X 1/2SAF (NEEDLE)
NEEDLE FILTER BLUNT 18X1 1/2 (NEEDLE) IMPLANT
NEEDLE HYPO 18GX1.5 SHARP (NEEDLE)
PATTIES SURGICAL .5 X.5 (GAUZE/BANDAGES/DRESSINGS) IMPLANT
SOL ANTI-FOG 6CC FOG-OUT (MISCELLANEOUS) IMPLANT
SOL FOG-OUT ANTI-FOG 6CC (MISCELLANEOUS)
SPONGE XRAY 4X4 16PLY STRL (MISCELLANEOUS) ×3 IMPLANT
SYR 10ML LL (SYRINGE) IMPLANT
SYR 3ML LL SCALE MARK (SYRINGE) IMPLANT
TOWEL OR 17X26 4PK STRL BLUE (TOWEL DISPOSABLE) ×3 IMPLANT
TUBING CONNECTING 10 (TUBING) ×2 IMPLANT
TUBING CONNECTING 10' (TUBING) ×1

## 2018-06-17 NOTE — Anesthesia Post-op Follow-up Note (Signed)
Anesthesia QCDR form completed.        

## 2018-06-17 NOTE — Anesthesia Procedure Notes (Signed)
Procedure Name: Intubation Date/Time: 06/17/2018 5:51 PM Performed by: Lily Kocher, CRNA Pre-anesthesia Checklist: Patient identified, Patient being monitored, Timeout performed, Emergency Drugs available and Suction available Patient Re-evaluated:Patient Re-evaluated prior to induction Oxygen Delivery Method: Circle system utilized Preoxygenation: Pre-oxygenation with 100% oxygen Induction Type: IV induction Ventilation: Mask ventilation without difficulty Laryngoscope Size: 3 and McGraph Grade View: Grade I Tube type: Oral Tube size: 7.5 mm Number of attempts: 1 Airway Equipment and Method: Stylet and Video-laryngoscopy Placement Confirmation: ETT inserted through vocal cords under direct vision,  positive ETCO2 and breath sounds checked- equal and bilateral Secured at: 22 cm Tube secured with: Tape Dental Injury: Teeth and Oropharynx as per pre-operative assessment  Difficulty Due To: Difficult Airway-  due to edematous airway

## 2018-06-17 NOTE — ED Provider Notes (Addendum)
[facility] Emergency Department Provider Note ____________________________________________   First MD Initiated Contact with Patient 06/17/18 1512     (approximate)  I have reviewed the triage vital signs and the nursing notes.   HISTORY  Chief Complaint Oral Swelling   HPI Jack Nicholson is a 57 y.o. male with a history of alcohol abuse, anemia and hypertension, recently started on lisinopril 5 days ago who is presenting with lower lip and right-sided facial swelling that started this morning.  He denies any pain.  Denies any voice change, throat pain or difficulty swallowing or breathing.  Past Medical History:  Diagnosis Date  . Alcohol abuse, daily use 01/2018   drinks several drinks of beer/hard liquor every day. has tremors and takes vitamin b12 for this  . Anemia    vitamin b12 deficiency  . Hypertension   . Tobacco abuse 01/2018   smokes at least 1 ppd. has heavy smokers' cough    There are no active problems to display for this patient.   Past Surgical History:  Procedure Laterality Date  . TONSILLECTOMY  1970  . WRIST SURGERY      Prior to Admission medications   Medication Sig Start Date End Date Taking? Authorizing Provider  lisinopril-hydrochlorothiazide (PRINZIDE,ZESTORETIC) 20-25 MG tablet Take 1 tablet by mouth daily. 06/12/18  Yes [provider]  famotidine (PEPCID) 20 MG tablet Take 1 tablet (20 mg total) by mouth 2 (two) times daily. Patient not taking: Reported on 06/17/2018 02/13/18   Domenick Gong, MD  loratadine (CLARITIN) 10 MG tablet Take 1 tablet (10 mg total) by mouth daily. Patient not taking: Reported on 06/17/2018 02/13/18   Domenick Gong, MD  predniSONE (STERAPRED UNI-PAK 21 TAB) 10 MG (21) TBPK tablet Dispense one 6 day pack. Take as directed with food. Patient not taking: Reported on 06/17/2018 02/13/18   Domenick Gong, MD    Allergies Lisinopril  No family history on file.  Social  History Social History   Tobacco Use  . Smoking status: Current Every Day Smoker    Packs/day: 1.00    Years: 40.00    Pack years: 40.00    Types: Cigarettes  . Smokeless tobacco: Never Used  Substance Use Topics  . Alcohol use: Yes    Alcohol/week: 4.0 - 8.0 standard drinks    Types: 2 - 4 Cans of beer, 2 - 4 Shots of liquor per week    Comment: drinks several drinks a night  . Drug use: Yes    Types: Marijuana    Review of Systems  Constitutional: No fever/chills Eyes: No visual changes. ENT: No sore throat. Cardiovascular: Denies chest pain. Respiratory: Denies shortness of breath. Gastrointestinal: No abdominal pain.  No nausea, no vomiting.  No diarrhea.  No constipation. Genitourinary: Negative for dysuria. Musculoskeletal: Negative for back pain. Skin: Negative for rash. Neurological: Negative for headaches, focal weakness or numbness.   ____________________________________________   PHYSICAL EXAM:  VITAL SIGNS: ED Triage Vitals  Enc Vitals Group     BP 06/17/18 1501 130/72     Pulse Rate 06/17/18 1501 (!) 103     Resp 06/17/18 1501 20     Temp 06/17/18 1501 99.8 F (37.7 C)     Temp Source 06/17/18 1501 Oral     SpO2 06/17/18 1501 98 %     Weight 06/17/18 1502 140 lb (63.5 kg)     Height 06/17/18 1502 5\' 11"  (1.803 m)     Head Circumference --  Peak Flow --      Pain Score 06/17/18 1502 0     Pain Loc --      Pain Edu? --      Excl. in GC? --     Constitutional: Alert and oriented.  No distress.  No respiratory distress.  Speaking in full sentences.  Controlling secretions. Eyes: Conjunctivae are normal.  Head: Atraumatic. Nose: No congestion/rhinnorhea. Mouth/Throat: Mucous membranes are moist.   Moderate swelling of the lower lip, diffusely.  Swelling also to the right lateral portion of the upper lip which is very mild.  Mild swelling as well extending onto the right cheek about 1 to 2 cm lateral to the lip.  However, there is no trismus.   On palpation there is no tenderness.  Poor dentition.  However, no abscesses palpated and no tenderness at the gumline throughout the mouth.  Neck: No stridor.   Cardiovascular: Normal rate, regular rhythm. Grossly normal heart sounds.   Respiratory: Normal respiratory effort.  No retractions. Lungs CTAB. Gastrointestinal: Soft and nontender. No distention. Musculoskeletal: No lower extremity tenderness nor edema.  No joint effusions. Neurologic:  Normal speech and language. No gross focal neurologic deficits are appreciated. Skin:  Skin is warm, dry and intact. No rash noted. Psychiatric: Mood and affect are normal. Speech and behavior are normal.  ____________________________________________   LABS (all labs ordered are listed, but only abnormal results are displayed)  Labs Reviewed - No data to display ____________________________________________  EKG   ____________________________________________  RADIOLOGY   ____________________________________________   PROCEDURES  Procedure(s) performed:   .Critical Care Performed by: Myrna Blazer, MD Authorized by: Myrna Blazer, MD   Critical care provider statement:    Critical care time (minutes):  35   Critical care time was exclusive of:  Separately billable procedures and treating other patients   Critical care was necessary to treat or prevent imminent or life-threatening deterioration of the following conditions: angioedema.   Critical care was time spent personally by me on the following activities:  Development of treatment plan with patient or surrogate, discussions with consultants, evaluation of patient's response to treatment, examination of patient, obtaining history from patient or surrogate, ordering and performing treatments and interventions, ordering and review of laboratory studies, ordering and review of radiographic studies, pulse oximetry, re-evaluation of patient's condition and review  of old charts    Critical Care performed:   ____________________________________________   INITIAL IMPRESSION / ASSESSMENT AND PLAN / ED COURSE  Pertinent labs & imaging results that were available during my care of the patient were reviewed by me and considered in my medical decision making (see chart for details).  DDX: Angioedema, cellulitis, dental infection As part of my medical decision making, I reviewed the following data within the electronic MEDICAL RECORD NUMBER Notes from prior ED visits  Patient given 50 mg of IV Benadryl prior to arrival.  Given 10 of Decadron as well as 20 of IV Pepcid here in the emergency department.  We will observe the patient for 4 hours.  ----------------------------------------- 5:12 PM on 06/17/2018 -----------------------------------------  Patient with worsening swelling of the lower and upper lips and now swelling to the left side of the face.  Discussed the case with Bud Face of ear nose and throat.  Patient was scoped at the bedside and he has posterior tongue as well as epiglottic involvement.  Patient to be intubated in the operating room.  We will give a racemic epi neb to temporize.  Patient  aware of diagnosis and plan willing to comply. ____________________________________________   FINAL CLINICAL IMPRESSION(S) / ED DIAGNOSES  Angioedema  NEW MEDICATIONS STARTED DURING THIS VISIT:  New Prescriptions   No medications on file     Note:  This document was prepared using Dragon voice recognition software and may include unintentional dictation errors.     Myrna Blazer, MD 06/17/18 1713    Pershing Proud Myra Rude, MD 06/17/18 [phone]

## 2018-06-17 NOTE — Transfer of Care (Signed)
Immediate Anesthesia Transfer of Care Note  Patient: Jack Nicholson  Procedure(s) Performed: INTUBATION (N/A )  Patient Location: PACU and ICU  Anesthesia Type:General  Level of Consciousness: Patient remains intubated per anesthesia plan  Airway & Oxygen Therapy: Patient placed on Ventilator (see vital sign flow sheet for setting)  Post-op Assessment: Report given to RN and Post -op Vital signs reviewed and stable  Post vital signs: Reviewed and stable  Last Vitals:  Vitals Value Taken Time  BP    Temp    Pulse    Resp    SpO2      Last Pain:  Vitals:   06/17/18 1502  TempSrc:   PainSc: 0-No pain         Complications: No apparent anesthesia complications

## 2018-06-17 NOTE — Consult Note (Signed)
Jack Nicholson, Jack Nicholson [id number] Nov 21, 1960 No att. providers found  Reason for Consult: Angioedema  HPI: 57 y.o. Male presented to ED around 2pm today for acute onset of lip and facial swelling.  He reports is began around 10am on lower lip and has spread to upper lip and cheek and neck.  While in ER, the patient continued to have worsening facial swelling including swelling of floor of mouth.  He denies any breathing or swallowing issues currently.  Allergies:  Allergies  Allergen Reactions  . Lisinopril Swelling    ROS: Review of systems normal other than 12 systems except per HPI.  PMH:  Past Medical History:  Diagnosis Date  . Alcohol abuse, daily use 01/2018   drinks several drinks of beer/hard liquor every day. has tremors and takes vitamin b12 for this  . Anemia    vitamin b12 deficiency  . Hypertension   . Tobacco abuse 01/2018   smokes at least 1 ppd. has heavy smokers' cough    FH: No family history on file.  SH:  Social History   Socioeconomic History  . Marital status: Married    Spouse name: Not on file  . Number of children: Not on file  . Years of education: Not on file  . Highest education level: Not on file  Occupational History  . Not on file  Social Needs  . Financial resource strain: Not on file  . Food insecurity:    Worry: Not on file    Inability: Not on file  . Transportation needs:    Medical: Not on file    Non-medical: Not on file  Tobacco Use  . Smoking status: Current Every Day Smoker    Packs/day: 1.00    Years: 40.00    Pack years: 40.00    Types: Cigarettes  . Smokeless tobacco: Never Used  Substance and Sexual Activity  . Alcohol use: Yes    Alcohol/week: 4.0 - 8.0 standard drinks    Types: 2 - 4 Cans of beer, 2 - 4 Shots of liquor per week    Comment: drinks several drinks a night  . Drug use: Yes    Types: Marijuana  . Sexual activity: Not on file  Lifestyle  . Physical activity:    Days per week: Not on file   Minutes per session: Not on file  . Stress: Not on file  Relationships  . Social connections:    Talks on phone: Not on file    Gets together: Not on file    Attends religious service: Not on file    Active member of club or organization: Not on file    Attends meetings of clubs or organizations: Not on file    Relationship status: Not on file  . Intimate partner violence:    Fear of current or ex partner: Not on file    Emotionally abused: Not on file    Physically abused: Not on file    Forced sexual activity: Not on file  Other Topics Concern  . Not on file  Social History Narrative  . Not on file    PSH:  Past Surgical History:  Procedure Laterality Date  . TONSILLECTOMY  1970  . WRIST SURGERY      Physical  Exam:  GEN-  NAD sitting upright in bed NEURO-CN 2-12 grossly intact and symmetric. EARS- EAC/TMs normal BL.  OC/OP-significant soft edema of upper and lower lips extending onto cheeks.  Floor of mouth swollen and full but  soft.  Poor dentition NECK-  Mild submandibular and submental swelling RESP-  Regular CARD-  RRR  Procedure:  Trans-nasal flexible laryngoscopy-  After verbal consent was obtained, the patient's nasal cavities were anesthetized with Oxymetazalone and 2% lidocaine.  A flexible laryngoscope was inserted into the patient's right nasal cavity.  This demonstrated  Normal nasopharynx.  Significant base of tongue edema and obliteration of the valleculae.  Normal appearing epiglottis and normal appearing vocal folds.  A/P: Angioedema worsening after medications with impending airway compromise  Plan:  To OR for emergent intubation.  Risks and benefits discussed with patient and wife.   Jack Nicholson 06/17/2018 6:01 PM

## 2018-06-17 NOTE — Op Note (Signed)
..  06/17/2018  6:06 PM    Alver Fisher  [id number]   Pre-Op Dx:  Angioedema, initial encounter [T78.3XXA]  Post-op Dx: Angioedema, initial encounter [T78.3XXA]  Proc: Stand by for tracheostomy for emergent Intubation  Surg: Ruhee Enck  Anes:  General  EBL:  None  Comp:  None  Findings:  Uncomplicated intubation by Anesthesia.  Procedure: With the patient in a comfortable supine position, general anesthesia medications were admininstered.  Anterior commisure scope and tracheostomy instruments were ready for emergent surgical airway placement.  Anesthesia intubated with McGraff scope and 7.5 ETT in a non-traumatic fashion.  Good breath sounds were heard bilaterally at length of 22.    Following this, the patient was taken to ICU in stable condition.  Dispo:  To ICU to intubated until swelling improves/airleak.  Miryah Ralls 6:06 PM 06/17/2018

## 2018-06-17 NOTE — Consult Note (Addendum)
PULMONARY / CRITICAL CARE MEDICINE   Name: Jack Nicholson MRN: [id number] DOB: 10/03/1961    ADMISSION DATE:  06/17/2018 CONSULTATION DATE:  06/17/2018  REFERRING MD:  Dr. Andee Poles  CHIEF COMPLAINT:  Angioedema  HISTORY OF PRESENT ILLNESS:   Jack Nicholson is a 57 y.o. Male with a PMH of ETOH abuse (daily use), anemia, HTN, and Tobacco abuse who presented to [facility] ED on 06/17/18 with c/o acute onset of Lip and facial swelling.  He reported that it began around 10 am on 9/4 with his lower lip, then began to spread to his upper lip, cheek, and neck.  He denied any voice changes, throat pain, or difficultly with swallowing or breathing.  The patient reported that he was recently started on Lisinopril 5 days ago.  In the ED the swelling continued to progress, encompassing the floor of his mouth.  Dr. Andee Poles with ENT was consulted, and a Laryngoscopy was performed which showed significant base of tongue edema with obliteration of the valleculae.  He was then taken emergently to the OR for intubation due to concern for impending airway compromise.  Pt returns from OR intubated and sedated.  PCCM is consulted for further management.  PAST MEDICAL HISTORY :  He  has a past medical history of Alcohol abuse, daily use (01/2018), Anemia, Hypertension, and Tobacco abuse (01/2018).  PAST SURGICAL HISTORY: He  has a past surgical history that includes Tonsillectomy (1970) and Wrist surgery.  Allergies  Allergen Reactions  . Lisinopril Swelling    No current facility-administered medications on file prior to encounter.    Current Outpatient Medications on File Prior to Encounter  Medication Sig  . famotidine (PEPCID) 20 MG tablet Take 1 tablet (20 mg total) by mouth 2 (two) times daily. (Patient not taking: Reported on 06/17/2018)  . loratadine (CLARITIN) 10 MG tablet Take 1 tablet (10 mg total) by mouth daily. (Patient not taking: Reported on 06/17/2018)  . predniSONE (STERAPRED UNI-PAK 21 TAB) 10 MG (21) TBPK  tablet Dispense one 6 day pack. Take as directed with food. (Patient not taking: Reported on 06/17/2018)    FAMILY HISTORY:  His family history is not on file.  SOCIAL HISTORY: He  reports that he has been smoking cigarettes. He has a 40.00 pack-year smoking history. He has never used smokeless tobacco. He reports that he drinks about 4.0 - 8.0 standard drinks of alcohol per week. He reports that he has current or past drug history. Drug: Marijuana.  REVIEW OF SYSTEMS:   Unable to obtain due to intubation and sedation  SUBJECTIVE:  Unable to obtain due to intubation and sedation  VITAL SIGNS: BP (!) 155/91   Pulse 81   Temp 97.8 F (36.6 C) (Axillary)   Resp 19   Ht 5\' 11"  (1.803 m)   Wt 63.5 kg   SpO2 98%   BMI 19.53 kg/m   HEMODYNAMICS:    VENTILATOR SETTINGS: Vent Mode: PRVC FiO2 (%):  [50 %] 50 % Set Rate:  [14 bmp] 14 bmp Vt Set:  [500 mL] 500 mL PEEP:  [5 cmH20] 5 cmH20  INTAKE / OUTPUT: I/O last 3 completed shifts: In: 945.4 [I.V.:900; IV Piggyback:45.4] Out: -   PHYSICAL EXAMINATION: General:  Acutely ill appearing male, laying in bed, intubated, in NAD Neuro:  Sedated, withdraws from pain, Pupils 1 mm sluggish bilaterally HEENT:  Atraumatic, normocephalic, significant swelling of lips and tongue, ETT in place Cardiovascular:  RRR, s1s2, no M/R/G, 2+ pulses throughout Lungs:  Clear bilaterally,  no wheezing, even, nonlabored, vent assisted Abdomen:  Soft, nontender, nondistended, BS+ x4 Musculoskeletal:  No deformities, normal bulk and tone, warm Skin:  No obvious rashes, lesions, or ulcerations  LABS:  BMET No results for input(s): NA, K, CL, CO2, BUN, CREATININE, GLUCOSE in the last 168 hours.  Electrolytes No results for input(s): CALCIUM, MG, PHOS in the last 168 hours.  CBC No results for input(s): WBC, HGB, HCT, PLT in the last 168 hours.  Coag's No results for input(s): APTT, INR in the last 168 hours.  Sepsis Markers No results for  input(s): LATICACIDVEN, PROCALCITON, O2SATVEN in the last 168 hours.  ABG No results for input(s): PHART, PCO2ART, PO2ART in the last 168 hours.  Liver Enzymes No results for input(s): AST, ALT, ALKPHOS, BILITOT, ALBUMIN in the last 168 hours.  Cardiac Enzymes No results for input(s): TROPONINI, PROBNP in the last 168 hours.  Glucose No results for input(s): GLUCAP in the last 168 hours.  Imaging No results found.   STUDIES:   CULTURES: Tracheal aspirate 9/4>>   ANTIBIOTICS:  SIGNIFICANT EVENTS: 06/17/18>> Presented to [facility] ED with angioedema requiring emergent intubation in OR  LINES/TUBES: 06/17/18 ETT>>  DISCUSSION: 57 y.o. Male admitted with severe Angioedema secondary to Lisinopril requiring emergent intubation in OR by ENT due to concern for impending respiratory compromise. Pt also with AKI, anion gap metabolic acidosis, and hyponatremia.  ASSESSMENT / PLAN:  PULMONARY A: Intubated for airway protection in setting of severe Angioedema and concern for impending respiratory compromise Patchy foci/inflammation/infection in LLL on CXR P:   Full vent support, PRVC: 8 cc/kg SBP when angioedema improved VAP bundle Pulmonary Hygiene Follow intermittent CXR and ABG ENT following, appreciate input IV Decadron 10 mg q8h, IV Benadryl 25 mg q8h, Pepcid IV  CARDIOVASCULAR A:  Hypotension, likely sedation related Hx: HTN P:  Cardiac monitoring Maintain MAP >65 Will give NS bolus NS @ 100 ml/hr Neosynephrine prn to maintain MAP goal Hold home antihypertensives  RENAL A:   AKI Anion gap Metabolic Acidosis Hyponatremia Hypomagnesemia  P:   Monitor I&O's / urinary output Follow BMP Ensure adequate renal perfusion Avoid nephrotoxic agents as able Replace electrolytes as indicated Obtain Renal US Trend Lactic acid Give NS bolus Will check serum Osmolality and urine Na Will replace Magnesium, recheck in am 9/5  GASTROINTESTINAL A:   No active issues P:    NPO for now Pepcid for SUP Consult dietician 9/5 for initiation of tube feeds  HEMATOLOGIC A:   No active issues Hx: Anemia P:  Monitor for s/sx of bleeding Trend CBC Heparin SQ for VTE prophylaxis Transfuse for Hgb<7  INFECTIOUS A:   Leukocytosis Patchy foci vs. Inflammation vs. Infection in LLL on CXR P:   Monitor fever curve Trend WBC and Procalcitonin If Procalcitonin elevated, will consider initiation of Abx Obtain Tracheal aspirate culture Follow up CXR in am 9/5  ENDOCRINE A:   Hyperglycemia P:   CBG's SSI Follow ICU hypo/hyperglycemia protocol  NEUROLOGIC A:   Sedation needs in setting of mechanical ventilation Hx: ETOH abuse, tobacco abuse P:   RASS goal: -3 to -4 Propofol & Fentanyl gtt's, prn Versed to maintain RASS goal PT IS MAINTAIN DEEP SEDATION TO PREVENT SELF EXTUBATION UNTIL RESOLUTION OF ANGIOEDEMA GIVEN DEGREE OF SWELLING Soft restraints Provide supportive care Thiamine and Folic acid   FAMILY  - Updates: No family present at bedside for update 06/17/18 during NP rounds.  - Inter-disciplinary family meet or Palliative Care meeting due by:  06/24/18  Harlon Ditty, AGACNP-BC [facility] Pager: [phone]   06/17/2018, 7:32 PM   I agree with the documented

## 2018-06-17 NOTE — ED Triage Notes (Signed)
Pt arrives to ER via EMS. Recently started on lisinopril and has swelling to lower lip and upper right side of lip. Denies trouble breathing.

## 2018-06-17 NOTE — ED Triage Notes (Signed)
Pt in via EMS with allergic reaction. Pt started on Lisinopril Saturday and now with swelling to lower lip. Pt given 50mg  benadryl and has a 18G in left arm.

## 2018-06-17 NOTE — Anesthesia Preprocedure Evaluation (Signed)
Anesthesia Evaluation  Patient identified by MRN, date of birth, ID band Patient awake    Reviewed: Allergy & Precautions, NPO status , Patient's Chart, lab work & pertinent test results  Airway Mallampati: III  TM Distance: <3 FB   Mouth opening: Limited Mouth Opening  Dental  (+) Poor Dentition, Upper Dentures   Pulmonary Current Smoker,    breath sounds clear to auscultation       Cardiovascular hypertension, Normal cardiovascular exam     Neuro/Psych negative neurological ROS  negative psych ROS   GI/Hepatic negative GI ROS, (+)     substance abuse  alcohol use,   Endo/Other  negative endocrine ROS  Renal/GU negative Renal ROS  negative genitourinary   Musculoskeletal negative musculoskeletal ROS (+)   Abdominal Normal abdominal exam  (+)   Peds negative pediatric ROS (+)  Hematology  (+) anemia ,   Anesthesia Other Findings Past Medical History: 01/2018: Alcohol abuse, daily use     Comment:  drinks several drinks of beer/hard liquor every day. has              tremors and takes vitamin b12 for this No date: Anemia     Comment:  vitamin b12 deficiency No date: Hypertension 01/2018: Tobacco abuse     Comment:  smokes at least 1 ppd. has heavy smokers' cough  Facial and lip swelling , but vocalizes well  Reproductive/Obstetrics                             Anesthesia Physical Anesthesia Plan  ASA: III and emergent  Anesthesia Plan: General   Post-op Pain Management:    Induction: Intravenous, Rapid sequence and Cricoid pressure planned  PONV Risk Score and Plan:   Airway Management Planned: Oral ETT  Additional Equipment:   Intra-op Plan:   Post-operative Plan: Post-operative intubation/ventilation  Informed Consent: I have reviewed the patients History and Physical, chart, labs and discussed the procedure including the risks, benefits and alternatives for the  proposed anesthesia with the patient or authorized representative who has indicated his/her understanding and acceptance.   Dental advisory given  Plan Discussed with: CRNA and Surgeon  Anesthesia Plan Comments:         Anesthesia Quick Evaluation

## 2018-06-17 NOTE — ED Notes (Signed)
MD at bedside using laryngoscope

## 2018-06-17 NOTE — ED Notes (Signed)
Patient transferred by orderly at this time

## 2018-06-18 ENCOUNTER — Inpatient Hospital Stay: Payer: PRIVATE HEALTH INSURANCE

## 2018-06-18 ENCOUNTER — Encounter: Payer: Self-pay | Admitting: Otolaryngology

## 2018-06-18 DIAGNOSIS — J9601 Acute respiratory failure with hypoxia: Secondary | ICD-10-CM

## 2018-06-18 LAB — BASIC METABOLIC PANEL
ANION GAP: 9 (ref 5–15)
BUN: 24 mg/dL — ABNORMAL HIGH (ref 6–20)
CALCIUM: 7.5 mg/dL — AB (ref 8.9–10.3)
CO2: 21 mmol/L — AB (ref 22–32)
Chloride: 99 mmol/L (ref 98–111)
Creatinine, Ser: 1.08 mg/dL (ref 0.61–1.24)
Glucose, Bld: 124 mg/dL — ABNORMAL HIGH (ref 70–99)
Potassium: 4.2 mmol/L (ref 3.5–5.1)
SODIUM: 129 mmol/L — AB (ref 135–145)

## 2018-06-18 LAB — CBC
HCT: 39.6 % — ABNORMAL LOW (ref 40.0–52.0)
Hemoglobin: 13.6 g/dL (ref 13.0–18.0)
MCH: 34.6 pg — ABNORMAL HIGH (ref 26.0–34.0)
MCHC: 34.4 g/dL (ref 32.0–36.0)
MCV: 100.7 fL — ABNORMAL HIGH (ref 80.0–100.0)
PLATELETS: 212 10*3/uL (ref 150–440)
RBC: 3.93 MIL/uL — ABNORMAL LOW (ref 4.40–5.90)
RDW: 14.4 % (ref 11.5–14.5)
WBC: 6.8 10*3/uL (ref 3.8–10.6)

## 2018-06-18 LAB — PHOSPHORUS: Phosphorus: 3.8 mg/dL (ref 2.5–4.6)

## 2018-06-18 LAB — GLUCOSE, CAPILLARY
GLUCOSE-CAPILLARY: 140 mg/dL — AB (ref 70–99)
GLUCOSE-CAPILLARY: 170 mg/dL — AB (ref 70–99)
Glucose-Capillary: 150 mg/dL — ABNORMAL HIGH (ref 70–99)
Glucose-Capillary: 124 mg/dL — ABNORMAL HIGH (ref 70–99)
Glucose-Capillary: 116 mg/dL — ABNORMAL HIGH (ref 70–99)
Glucose-Capillary: 133 mg/dL — ABNORMAL HIGH (ref 70–99)

## 2018-06-18 LAB — PROCALCITONIN
PROCALCITONIN: 0.12 ng/mL
PROCALCITONIN: 0.1 ng/mL

## 2018-06-18 LAB — LACTIC ACID, PLASMA: LACTIC ACID, VENOUS: 1 mmol/L (ref 0.5–1.9)

## 2018-06-18 LAB — MAGNESIUM: MAGNESIUM: 2.2 mg/dL (ref 1.7–2.4)

## 2018-06-18 MED ORDER — VANCOMYCIN HCL IN DEXTROSE 1-5 GM/200ML-% IV SOLN
1000.0000 mg | Freq: Once | INTRAVENOUS | Status: AC
  Administered 2018-06-18: 1000 mg via INTRAVENOUS
  Filled 2018-06-18: qty 200

## 2018-06-18 MED ORDER — CHLORDIAZEPOXIDE HCL 10 MG PO CAPS
10.0000 mg | ORAL_CAPSULE | Freq: Three times a day (TID) | ORAL | Status: DC
  Administered 2018-06-18 (×2): 10 mg via ORAL
  Filled 2018-06-18 (×2): qty 1

## 2018-06-18 MED ORDER — FENTANYL CITRATE (PF) 100 MCG/2ML IJ SOLN: 25.0000 ug | INTRAMUSCULAR | Status: DC | PRN

## 2018-06-18 MED ORDER — FAMOTIDINE 20 MG PO TABS
20.0000 mg | ORAL_TABLET | Freq: Two times a day (BID) | ORAL | Status: DC
  Administered 2018-06-18 – 2018-06-19 (×4): 20 mg
  Filled 2018-06-18 (×4): qty 1

## 2018-06-18 MED ORDER — VANCOMYCIN HCL IN DEXTROSE 750-5 MG/150ML-% IV SOLN
750.0000 mg | Freq: Two times a day (BID) | INTRAVENOUS | Status: DC
  Administered 2018-06-18 – 2018-06-20 (×5): 750 mg via INTRAVENOUS
  Filled 2018-06-18 (×6): qty 150

## 2018-06-18 MED ORDER — CHLORDIAZEPOXIDE HCL 10 MG PO CAPS
10.0000 mg | ORAL_CAPSULE | Freq: Three times a day (TID) | ORAL | Status: DC
  Administered 2018-06-18 – 2018-06-19 (×4): 10 mg
  Filled 2018-06-18 (×4): qty 1

## 2018-06-18 MED ORDER — ONDANSETRON HCL 4 MG/2ML IJ SOLN: 4.0000 mg | Freq: Four times a day (QID) | INTRAMUSCULAR | Status: DC

## 2018-06-18 MED ORDER — PIPERACILLIN-TAZOBACTAM 3.375 G IVPB
3.3750 g | Freq: Three times a day (TID) | INTRAVENOUS | Status: DC
  Administered 2018-06-18 – 2018-06-22 (×12): 3.375 g via INTRAVENOUS
  Filled 2018-06-18 (×11): qty 50

## 2018-06-18 MED ORDER — DEXAMETHASONE SODIUM PHOSPHATE 10 MG/ML IJ SOLN
6.0000 mg | Freq: Three times a day (TID) | INTRAMUSCULAR | Status: DC
  Administered 2018-06-18 – 2018-06-19 (×3): 6 mg via INTRAVENOUS
  Filled 2018-06-18 (×5): qty 0.6

## 2018-06-18 MED ORDER — DOPAMINE-DEXTROSE 3.2-5 MG/ML-% IV SOLN
0.0000 ug/kg/min | INTRAVENOUS | Status: DC
  Administered 2018-06-18: 5 ug/kg/min via INTRAVENOUS
  Filled 2018-06-18: qty 250

## 2018-06-18 MED ORDER — ADULT MULTIVITAMIN LIQUID CH
15.0000 mL | Freq: Every day | ORAL | Status: DC
  Administered 2018-06-18 – 2018-06-22 (×5): 15 mL
  Filled 2018-06-18 (×5): qty 15

## 2018-06-18 MED ORDER — ONDANSETRON HCL 4 MG/2ML IJ SOLN: 4.0000 mg | Freq: Once | INTRAMUSCULAR | Status: DC | PRN

## 2018-06-18 MED ORDER — VITAL HIGH PROTEIN PO LIQD
1000.0000 mL | ORAL | Status: DC
  Administered 2018-06-18 – 2018-06-19 (×2): 1000 mL

## 2018-06-18 NOTE — Progress Notes (Signed)
Initial Nutrition Assessment  DOCUMENTATION CODES:   Severe malnutrition in context of chronic illness  INTERVENTION:  Initiate Vital High Protein at 50 mL/hr (1200 mL goal daily volume) via OGT. Provides 1200 kcal, 105 grams of protein, 1008 mL H2O daily. With current propofol rate provides 1704 kcal daily.  Provide liquid MVI daily per tube.  Continue thiamine 100 mg daily and folic acid 1 mg daily.  Provide free water flush of 30 mL Q4hrs.  Monitor magnesium, potassium, and phosphorus daily for at least 3 days, MD to replete as needed, as pt is at risk for refeeding syndrome given severe malnutrition, EtOH abuse.  NUTRITION DIAGNOSIS:   Severe Malnutrition related to chronic illness(EtOH abuse) as evidenced by severe fat depletion, moderate muscle depletion, severe muscle depletion.  GOAL:   Provide needs based on ASPEN/SCCM guidelines  MONITOR:   Vent status, Labs, Weight trends, TF tolerance, I & O's  REASON FOR ASSESSMENT:   Ventilator    ASSESSMENT:   57 year old male with PMHx of EtOH abuse, tobacco abuse, macrocytic anemia (vitamin B12 deficiency), HTN admitted with angioedema likely due to lisinopril, required intubation on 9/4 for airway protection, atelectasis and possible aspiration PNA.   Patient intubated and sedated. On PRVC mode with FiO2 40% and PEEP 5 cmH2O. Abdomen feels soft. Per discussion on rounds facial swelling/angioedema is worse today. Very limited weight history in chart. Patient was 62.6 kg on 01/30/2018 and is currently 62 kg.  Access: 18 FR. OGT placed 9/4; terminates in proximal stomach per abdominal x-ray 9/4; 65 cm at corner of mouth  MAP: 75-99 mmHg  Patient is currently intubated on ventilator support Ve: 7.9 L/min Temp (24hrs), Avg:98.2 F (36.8 C), Min:97.6 F (36.4 C), Max:99.8 F (37.7 C)  Propofol: 19.1 ml/hr (504 kcal daily)  Medications reviewed and include: Decadron 6 mg Q8hrs IV, Benadryl IV, famotidine, folic acid 1 mg  daily, Novolog 0-9 units Q4hrs, thiamine 100 mg daily, NS @ 100 mL/hr, dopamine gtt at 2.5 mcg/kg/min, fentanyl gtt, Zosy, propofol gtt, Zosyn.  Labs reviewed: CBG 114-140, Sodium 129, CO2 21, BUN 24. Phosphorus 5 on 9/4. No Vitamin B12 levels in chart.  I/O: 1650 mL UOP overnight  Discussed with RN and on rounds.  NUTRITION - FOCUSED PHYSICAL EXAM:    Most Recent Value  Orbital Region  Unable to assess  Upper Arm Region  Severe depletion  Thoracic and Lumbar Region  Severe depletion  Buccal Region  Unable to assess  Temple Region  Severe depletion  Clavicle Bone Region  Moderate depletion  Clavicle and Acromion Bone Region  Severe depletion  Scapular Bone Region  Unable to assess  Dorsal Hand  Moderate depletion  Patellar Region  Severe depletion  Anterior Thigh Region  Severe depletion  Posterior Calf Region  Moderate depletion  Edema (RD Assessment)  None  Hair  Reviewed  Eyes  Unable to assess  Mouth  Unable to assess  Skin  Reviewed  Nails  Reviewed     Diet Order:   Diet Order    None      EDUCATION NEEDS:   No education needs have been identified at this time  Skin:  Skin Assessment: Reviewed RN Assessment  Last BM:  Unknown/PTA  Height:   Ht Readings from Last 1 Encounters:  06/17/18 5\' 11"  (1.803 m)    Weight:   Wt Readings from Last 1 Encounters:  06/18/18 62 kg    Ideal Body Weight:  78.2 kg  BMI:  Body mass index is 19.06 kg/m.  Estimated Nutritional Needs:   Kcal:   1746 (PSU 2003b w/ MSJ 1476, Ve 7.9, Tmax 37.7)  Protein:  93-112 grams (1.5-1.8 grams/kg)  Fluid:  1.2-1.5 L/day  Helane Rima, MS, RD, LDN Office: [phone] Pager: [phone] After Hours/Weekend Pager: [phone]

## 2018-06-18 NOTE — Progress Notes (Signed)
PHARMACIST - PHYSICIAN COMMUNICATION  CONCERNING: IV to Oral Route Change Policy  RECOMMENDATION: This patient is receiving famotidine by the intravenous route.  Based on criteria approved by the Pharmacy and Therapeutics Committee, the intravenous medication(s) is/are being converted to the equivalent oral dose form(s).   DESCRIPTION: These criteria include:  The patient is eating (either orally or via tube) and/or has been taking other orally administered medications for a least 24 hours  The patient has no evidence of active gastrointestinal bleeding or impaired GI absorption (gastrectomy, short bowel, patient on TNA or NPO).  If you have questions about this conversion, please contact the Pharmacy Department  []   [phone] )  [facility] [x]   [phone] )  [facility] []   [phone] )  [facility] []   [phone] )  [facility] []   [phone] )  [facility]   Simpson,Michael L, [facility] 06/18/2018 11:58 AM

## 2018-06-18 NOTE — Progress Notes (Signed)
Pt's Procalcitonin is 0.12.  Will hold off on starting Abx at this time.  Will continue to trend PCT, WBC, and follow up CXR.

## 2018-06-18 NOTE — Progress Notes (Signed)
Pharmacy Antibiotic Note  Jack Nicholson is a 57 y.o. male admitted on 06/17/2018 with angioedema. Per rounds with MD, concern for possible aspiration pneumonia. Pharmacy has been consulted for vancomycin and Zosyn dosing.  Plan: Start Zosyn 3.375 g IV extended infusion q8h Vancomycin 1 g IV x 1 dose given. Will start 750 mg IV q12h to start today @ 1800. Goal trough 15-20 mcg/mL. Trough ordered for 9/7 @ 0530  Height: 5\' 11"  (180.3 cm) Weight: 136 lb 11 oz (62 kg) IBW/kg (Calculated) : 75.3  Temp (24hrs), Avg:98.2 F (36.8 C), Min:97.6 F (36.4 C), Max:99.8 F (37.7 C)  Recent Labs  Lab 06/17/18 1946 06/17/18 2101 06/17/18 2335 06/18/18 0306  WBC 11.8*  --   --  6.8  CREATININE 1.28*  --   --  1.08  LATICACIDVEN  --  1.3 1.0  --     Estimated Creatinine Clearance: 67 mL/min (by C-G formula based on SCr of 1.08 mg/dL).    Allergies  Allergen Reactions  . Lisinopril Swelling    Antimicrobials this admission: Vancomycin 9/5 >> Zosyn 9/5 >>  Dose adjustments this admission: N/A  Microbiology results: 9/4 MRSA PCR: positive  Thank you for allowing pharmacy to be a part of this patient's care.  Pricilla Riffle, PharmD Pharmacy Resident  06/18/2018 12:08 PM

## 2018-06-18 NOTE — Final Consult Note (Signed)
.. 06/18/2018 9:31 AM  Jack Nicholson [id number]  Hospital Day 2    Temp:  [97.6 F (36.4 C)-99.8 F (37.7 C)] 97.6 F (36.4 C) (09/05 0345) Pulse Rate:  [51-103] 58 (09/05 0759) Resp:  [16-25] 16 (09/05 0759) BP: (65-155)/(54-99) 105/72 (09/05 0630) SpO2:  [94 %-100 %] 99 % (09/05 0759) FiO2 (%):  [40 %-50 %] 45 % (09/05 0455) Weight:  [59.8 kg-63.5 kg] 60 kg (09/05 0345),     Intake/Output Summary (Last 24 hours) at 06/18/2018 0931 Last data filed at 06/18/2018 0600 Gross per 24 hour  Intake 3566.28 ml  Output 1650 ml  Net 1916.28 ml    Results for orders placed or performed during the hospital encounter of 06/17/18 (from the past 24 hour(s))  Glucose, capillary     Status: Abnormal   Collection Time: 06/17/18  6:49 PM  Result Value Ref Range   Glucose-Capillary 150 (H) 70 - 99 mg/dL  MRSA PCR Screening     Status: Abnormal   Collection Time: 06/17/18  7:09 PM  Result Value Ref Range   MRSA by PCR POSITIVE (A) NEGATIVE  Blood gas, arterial     Status: Abnormal   Collection Time: 06/17/18  7:27 PM  Result Value Ref Range   FIO2 0.50    Delivery systems VENTILATOR    Mode PRESSURE REGULATED VOLUME CONTROL    VT 500 mL   LHR 14 resp/min   Peep/cpap 5.0 cm H20   pH, Arterial 7.27 (L) 7.350 - 7.450   pCO2 arterial 48 32.0 - 48.0 mmHg   pO2, Arterial 85 83.0 - 108.0 mmHg   Bicarbonate 22.0 20.0 - 28.0 mmol/L   Acid-base deficit 5.2 (H) 0.0 - 2.0 mmol/L   O2 Saturation 94.8 %   Patient temperature 37.0    Collection site LEFT BRACHIAL    Sample type ARTERIAL DRAW    Allens test (pass/fail) PASS PASS  Comprehensive metabolic panel     Status: Abnormal   Collection Time: 06/17/18  7:46 PM  Result Value Ref Range   Sodium 125 (L) 135 - 145 mmol/L   Potassium 3.6 3.5 - 5.1 mmol/L   Chloride 91 (L) 98 - 111 mmol/L   CO2 21 (L) 22 - 32 mmol/L   Glucose, Bld 155 (H) 70 - 99 mg/dL   BUN 25 (H) 6 - 20 mg/dL   Creatinine, Ser 7.82 (H) 0.61 - 1.24 mg/dL   Calcium 8.1  (L) 8.9 - 10.3 mg/dL   Total Protein 7.4 6.5 - 8.1 g/dL   Albumin 3.9 3.5 - 5.0 g/dL   AST 32 15 - 41 U/L   ALT 16 0 - 44 U/L   Alkaline Phosphatase 54 38 - 126 U/L   Total Bilirubin 1.2 0.3 - 1.2 mg/dL   GFR calc non Af Amer >60 >60 mL/min   GFR calc Af Amer >60 >60 mL/min   Anion gap 13 5 - 15  Magnesium     Status: Abnormal   Collection Time: 06/17/18  7:46 PM  Result Value Ref Range   Magnesium 1.2 (L) 1.7 - 2.4 mg/dL  Phosphorus     Status: Abnormal   Collection Time: 06/17/18  7:46 PM  Result Value Ref Range   Phosphorus 5.0 (H) 2.5 - 4.6 mg/dL  CBC with Differential/Platelet     Status: Abnormal   Collection Time: 06/17/18  7:46 PM  Result Value Ref Range   WBC 11.8 (H) 3.8 - 10.6 K/uL   RBC 4.34 (L)  4.40 - 5.90 MIL/uL   Hemoglobin 14.8 13.0 - 18.0 g/dL   HCT 87.5 64.3 - 32.9 %   MCV 98.8 80.0 - 100.0 fL   MCH 34.0 26.0 - 34.0 pg   MCHC 34.4 32.0 - 36.0 g/dL   RDW 51.8 84.1 - 66.0 %   Platelets 212 150 - 440 K/uL   Neutrophils Relative % 96 %   Neutro Abs 11.3 (H) 1.4 - 6.5 K/uL   Lymphocytes Relative 3 %   Lymphs Abs 0.3 (L) 1.0 - 3.6 K/uL   Monocytes Relative 1 %   Monocytes Absolute 0.1 (L) 0.2 - 1.0 K/uL   Eosinophils Relative 0 %   Eosinophils Absolute 0.0 0 - 0.7 K/uL   Basophils Relative 0 %   Basophils Absolute 0.0 0 - 0.1 K/uL  Protime-INR     Status: None   Collection Time: 06/17/18  7:46 PM  Result Value Ref Range   Prothrombin Time 13.8 11.4 - 15.2 seconds   INR 1.07   APTT     Status: None   Collection Time: 06/17/18  7:46 PM  Result Value Ref Range   aPTT 34 24 - 36 seconds  Triglycerides     Status: None   Collection Time: 06/17/18  7:46 PM  Result Value Ref Range   Triglycerides 125 <150 mg/dL  Procalcitonin - Baseline     Status: None   Collection Time: 06/17/18  8:08 PM  Result Value Ref Range   Procalcitonin 0.12 ng/mL  Osmolality     Status: Abnormal   Collection Time: 06/17/18  9:01 PM  Result Value Ref Range   Osmolality 272 (L)  275 - 295 mOsm/kg  Lactic acid, plasma     Status: None   Collection Time: 06/17/18  9:01 PM  Result Value Ref Range   Lactic Acid, Venous 1.3 0.5 - 1.9 mmol/L  Glucose, capillary     Status: Abnormal   Collection Time: 06/17/18  9:13 PM  Result Value Ref Range   Glucose-Capillary 158 (H) 70 - 99 mg/dL  Sodium, urine, random     Status: None   Collection Time: 06/17/18  9:36 PM  Result Value Ref Range   Sodium, Ur 39 mmol/L  Urine Drug Screen, Qualitative ([facility] only)     Status: Abnormal   Collection Time: 06/17/18  9:36 PM  Result Value Ref Range   Tricyclic, Ur Screen NONE DETECTED NONE DETECTED   Amphetamines, Ur Screen NONE DETECTED NONE DETECTED   MDMA (Ecstasy)Ur Screen NONE DETECTED NONE DETECTED   Cocaine Metabolite,Ur [place] NONE DETECTED NONE DETECTED   Opiate, Ur Screen NONE DETECTED NONE DETECTED   Phencyclidine (PCP) Ur S NONE DETECTED NONE DETECTED   Cannabinoid 50 Ng, Ur [place] NONE DETECTED NONE DETECTED   Barbiturates, Ur Screen NONE DETECTED NONE DETECTED   Benzodiazepine, Ur Scrn TEST NOT PERFORMED, REAGENT NOT AVAILABLE (A) NONE DETECTED   Methadone Scn, Ur NONE DETECTED NONE DETECTED  Lactic acid, plasma     Status: None   Collection Time: 06/17/18 11:35 PM  Result Value Ref Range   Lactic Acid, Venous 1.0 0.5 - 1.9 mmol/L  Glucose, capillary     Status: Abnormal   Collection Time: 06/17/18 11:40 PM  Result Value Ref Range   Glucose-Capillary 114 (H) 70 - 99 mg/dL  Procalcitonin     Status: None   Collection Time: 06/18/18  3:06 AM  Result Value Ref Range   Procalcitonin 0.10 ng/mL  CBC  Status: Abnormal   Collection Time: 06/18/18  3:06 AM  Result Value Ref Range   WBC 6.8 3.8 - 10.6 K/uL   RBC 3.93 (L) 4.40 - 5.90 MIL/uL   Hemoglobin 13.6 13.0 - 18.0 g/dL   HCT 40.9 (L) 81.1 - 91.4 %   MCV 100.7 (H) 80.0 - 100.0 fL   MCH 34.6 (H) 26.0 - 34.0 pg   MCHC 34.4 32.0 - 36.0 g/dL   RDW 78.2 95.6 - 21.3 %   Platelets 212 150 - 440 K/uL  Basic metabolic  panel     Status: Abnormal   Collection Time: 06/18/18  3:06 AM  Result Value Ref Range   Sodium 129 (L) 135 - 145 mmol/L   Potassium 4.2 3.5 - 5.1 mmol/L   Chloride 99 98 - 111 mmol/L   CO2 21 (L) 22 - 32 mmol/L   Glucose, Bld 124 (H) 70 - 99 mg/dL   BUN 24 (H) 6 - 20 mg/dL   Creatinine, Ser 0.86 0.61 - 1.24 mg/dL   Calcium 7.5 (L) 8.9 - 10.3 mg/dL   GFR calc non Af Amer >60 >60 mL/min   GFR calc Af Amer >60 >60 mL/min   Anion gap 9 5 - 15  Magnesium     Status: None   Collection Time: 06/18/18  3:06 AM  Result Value Ref Range   Magnesium 2.2 1.7 - 2.4 mg/dL  Glucose, capillary     Status: Abnormal   Collection Time: 06/18/18  3:44 AM  Result Value Ref Range   Glucose-Capillary 140 (H) 70 - 99 mg/dL  Glucose, capillary     Status: Abnormal   Collection Time: 06/18/18  7:18 AM  Result Value Ref Range   Glucose-Capillary 124 (H) 70 - 99 mg/dL    SUBJECTIVE:  Sedated and intubated.  Issues with anxiety initially.  OBJECTIVE:  GEN-  Sedated and intubated FACE-  Worsening right facial edema surrounding eye, improving left facial edema and lower lip edema.  Improved floor of mouth edema and submental edema  IMPRESSION:  Angioedema- evolving  PLAN:  Some improvement in areas but worsening in others.  Recommend continued intubation until facial edema has significantly improved and airleak.  He did not have any vocal fold or epiglottic edema so once floor of mouth/facial swelling better and air leak should be ok to extubate per ICU.  Please re-consult if any concerns or questions.  Jack Nicholson 06/18/2018, 9:31 AM

## 2018-06-18 NOTE — Progress Notes (Signed)
eLink Physician-Brief Progress Note Patient Name: Jack Nicholson DOB: [birth year] MRN: [id number]   Date of Service  06/18/2018  HPI/Events of Note  Pt on ventilator. Needs safety restraint orders renewed.  eICU Interventions  Restraint orders renewed.        Thomasene Lot Ogan 06/18/2018, 8:45 PM

## 2018-06-18 NOTE — Progress Notes (Signed)
   Name: Jack Nicholson MRN: [id number] DOB: 1961-09-20     CONSULTATION DATE: 06/17/2018  Subjective & objective: Patient remains on ventilator sedated with propofol + fentanyl + as needed Versed.  Hypotensive dopamine  PAST MEDICAL HISTORY :   has a past medical history of Alcohol abuse, daily use (01/2018), Anemia, Hypertension, and Tobacco abuse (01/2018).  has a past surgical history that includes Tonsillectomy (1970) and Wrist surgery. Prior to Admission medications   Medication Sig Start Date End Date Taking? Authorizing Provider  famotidine (PEPCID) 20 MG tablet Take 1 tablet (20 mg total) by mouth 2 (two) times daily. Patient not taking: Reported on 06/17/2018 02/13/18   Domenick Gong, MD  loratadine (CLARITIN) 10 MG tablet Take 1 tablet (10 mg total) by mouth daily. Patient not taking: Reported on 06/17/2018 02/13/18   Domenick Gong, MD  predniSONE (STERAPRED UNI-PAK 21 TAB) 10 MG (21) TBPK tablet Dispense one 6 day pack. Take as directed with food. Patient not taking: Reported on 06/17/2018 02/13/18   Domenick Gong, MD   Allergies  Allergen Reactions  . Lisinopril Swelling    FAMILY HISTORY:  family history is not on file. SOCIAL HISTORY:  reports that he has been smoking cigarettes. He has a 40.00 pack-year smoking history. He has never used smokeless tobacco. He reports that he drinks about 4.0 - 8.0 standard drinks of alcohol per week. He reports that he has current or past drug history. Drug: Marijuana.  REVIEW OF SYSTEMS:   Unable to obtain due to critical illness   VITAL SIGNS: Temp:  [97.6 F (36.4 C)-99.8 F (37.7 C)] 97.6 F (36.4 C) (09/05 0345) Pulse Rate:  [51-103] 58 (09/05 0759) Resp:  [16-25] 16 (09/05 0759) BP: (65-155)/(54-99) 105/72 (09/05 0630) SpO2:  [94 %-100 %] 99 % (09/05 0759) FiO2 (%):  [40 %-50 %] 45 % (09/05 0455) Weight:  [59.8 kg-63.5 kg] 60 kg (09/05 0345)  Physical Examination:  Sedated with propofol + fentanyl to RASS -4 On  vent, no distress, marked swelling of the face lips and tongue.  Bilateral equal air entry and no adventitious sounds S1 & S2 are audible with no murmur Benign abdominal exam with feeble peristalsis Within normal extremities and no peripheral edema  ASSESSMENT / PLAN:  Acute respiratory failure intubated for airway protection -Optimize sedation, monitor airway and continuous ventilator support  Angioedema likely due to lisinopril.  [place] swelling of lips, face, tongue -Steroid + H2-blocker   Atelectasis and possible aspiration pneumonia.  Worsening bibasilar airspace disease on the chest x-ray.  MRSA PCR positive -Empiric Zosyn + vancomycin.  Monitor CXR + CBC + FiO2 requirement -Consider de-escalation of antimicrobial with clinical improvement and clearing chest x-ray  Hypotension with meds -Optimize meds, volume, pressors and monitor hemodynamics  Prerenal azotemia with intravascular volume depletion -Optimize hydration, monitor renal panel and urine output  Chronic hyponatremia with potomania and volume depletion -Optimize hydration and monitor electrolytes  History of alcohol abuse -Librium + thiamine + folic acid and watch for DTs  Full code  DVT & GI prophylaxis.  Continue with supportive care  Critical care time 35 minutes

## 2018-06-19 ENCOUNTER — Inpatient Hospital Stay: Payer: PRIVATE HEALTH INSURANCE

## 2018-06-19 LAB — GLUCOSE, CAPILLARY
GLUCOSE-CAPILLARY: 160 mg/dL — AB (ref 70–99)
GLUCOSE-CAPILLARY: 176 mg/dL — AB (ref 70–99)
GLUCOSE-CAPILLARY: 180 mg/dL — AB (ref 70–99)
GLUCOSE-CAPILLARY: 96 mg/dL (ref 70–99)
Glucose-Capillary: 130 mg/dL — ABNORMAL HIGH (ref 70–99)
Glucose-Capillary: 136 mg/dL — ABNORMAL HIGH (ref 70–99)
Glucose-Capillary: 141 mg/dL — ABNORMAL HIGH (ref 70–99)

## 2018-06-19 LAB — CBC WITH DIFFERENTIAL/PLATELET
BASOS ABS: 0.1 10*3/uL (ref 0–0.1)
Basophils Relative: 1 %
Eosinophils Absolute: 0 10*3/uL (ref 0–0.7)
Eosinophils Relative: 0 %
HCT: 36 % — ABNORMAL LOW (ref 40.0–52.0)
HEMOGLOBIN: 12.4 g/dL — AB (ref 13.0–18.0)
LYMPHS ABS: 0.4 10*3/uL — AB (ref 1.0–3.6)
Lymphocytes Relative: 2 %
MCH: 35 pg — AB (ref 26.0–34.0)
MCHC: 34.5 g/dL (ref 32.0–36.0)
MCV: 101.4 fL — ABNORMAL HIGH (ref 80.0–100.0)
Monocytes Absolute: 0.7 10*3/uL (ref 0.2–1.0)
Monocytes Relative: 5 %
NEUTROS PCT: 92 %
Neutro Abs: 13.7 10*3/uL — ABNORMAL HIGH (ref 1.4–6.5)
Platelets: 203 10*3/uL (ref 150–440)
RBC: 3.55 MIL/uL — AB (ref 4.40–5.90)
RDW: 14.5 % (ref 11.5–14.5)
WBC: 14.9 10*3/uL — AB (ref 3.8–10.6)

## 2018-06-19 LAB — BASIC METABOLIC PANEL
ANION GAP: 5 (ref 5–15)
BUN: 25 mg/dL — AB (ref 6–20)
CHLORIDE: 106 mmol/L (ref 98–111)
CO2: 24 mmol/L (ref 22–32)
Calcium: 7.5 mg/dL — ABNORMAL LOW (ref 8.9–10.3)
Creatinine, Ser: 1.06 mg/dL (ref 0.61–1.24)
Glucose, Bld: 135 mg/dL — ABNORMAL HIGH (ref 70–99)
POTASSIUM: 4.2 mmol/L (ref 3.5–5.1)
Sodium: 135 mmol/L (ref 135–145)

## 2018-06-19 LAB — BLOOD GAS, ARTERIAL
ACID-BASE DEFICIT: 2 mmol/L (ref 0.0–2.0)
Bicarbonate: 24.7 mmol/L (ref 20.0–28.0)
FIO2: 40
Mechanical Rate: 16
O2 SAT: 96.3 %
PCO2 ART: 49 mmHg — AB (ref 32.0–48.0)
PEEP/CPAP: 5 cmH2O
PH ART: 7.31 — AB (ref 7.350–7.450)
Patient temperature: 37
VT: 500 mL
pO2, Arterial: 92 mmHg (ref 83.0–108.0)

## 2018-06-19 LAB — TRIGLYCERIDES: TRIGLYCERIDES: 87 mg/dL (ref <150)

## 2018-06-19 LAB — MAGNESIUM: MAGNESIUM: 2.4 mg/dL (ref 1.7–2.4)

## 2018-06-19 LAB — HIV ANTIBODY (ROUTINE TESTING W REFLEX): HIV Screen 4th Generation wRfx: NONREACTIVE

## 2018-06-19 LAB — PROCALCITONIN

## 2018-06-19 LAB — PHOSPHORUS: PHOSPHORUS: 2.9 mg/dL (ref 2.5–4.6)

## 2018-06-19 LAB — CALCIUM, IONIZED: Calcium, Ionized, Serum: 4.3 mg/dL — ABNORMAL LOW (ref 4.5–5.6)

## 2018-06-19 MED ORDER — FREE WATER
200.0000 mL | Status: DC
  Administered 2018-06-19 – 2018-06-20 (×5): 200 mL

## 2018-06-19 MED ORDER — DIPHENHYDRAMINE HCL 50 MG/ML IJ SOLN
12.5000 mg | Freq: Two times a day (BID) | INTRAMUSCULAR | Status: DC
  Administered 2018-06-19: 12.5 mg via INTRAVENOUS
  Filled 2018-06-19: qty 1

## 2018-06-19 MED ORDER — DEXAMETHASONE SODIUM PHOSPHATE 10 MG/ML IJ SOLN
4.0000 mg | Freq: Two times a day (BID) | INTRAMUSCULAR | Status: DC
  Administered 2018-06-19: 4 mg via INTRAVENOUS
  Filled 2018-06-19 (×2): qty 0.4

## 2018-06-19 NOTE — Progress Notes (Signed)
Pharmacy Antibiotic Note  Jack Nicholson is a 57 y.o. male admitted on 06/17/2018 with angioedema. Per rounds with MD, concern for possible aspiration pneumonia. Pharmacy has been consulted for vancomycin and Zosyn dosing.  Plan: Continue Zosyn 3.375 g IV extended infusion q8h Continue vancomycin 750 mg IV q12h. Goal trough 15-20 mcg/mL. Trough ordered for 9/7 @ 0530  Per CCM, will consider discontinuing vanc when cultures come back   Height: 5\' 11"  (180.3 cm) Weight: 141 lb 1.5 oz (64 kg) IBW/kg (Calculated) : 75.3  Temp (24hrs), Avg:97.8 F (36.6 C), Min:97.6 F (36.4 C), Max:98.1 F (36.7 C)  Recent Labs  Lab 06/17/18 1946 06/17/18 2101 06/17/18 2335 06/18/18 0306 06/19/18 0609  WBC 11.8*  --   --  6.8 14.9*  CREATININE 1.28*  --   --  1.08 1.06  LATICACIDVEN  --  1.3 1.0  --   --     Estimated Creatinine Clearance: 70.4 mL/min (by C-G formula based on SCr of 1.06 mg/dL).    Allergies  Allergen Reactions  . Lisinopril Swelling    Antimicrobials this admission: Vancomycin 9/5 >> Zosyn 9/5 >>  Dose adjustments this admission: N/A  Microbiology results: 9/6 Blood Cx: pending 9/6 Respiratory Cx: pending 9/4 MRSA PCR: positive  Thank you for allowing pharmacy to be a part of this patient's care.  Pricilla Riffle, PharmD Pharmacy Resident  06/19/2018 11:17 AM

## 2018-06-19 NOTE — Progress Notes (Addendum)
   Name: Jack Nicholson MRN: [id number] DOB: 04/28/1961     CONSULTATION DATE: 06/17/2018 Subjective & objective: Remains on the ventilator sedated with propofol and fentanyl.  Improved facial swelling  PAST MEDICAL HISTORY :   has a past medical history of Alcohol abuse, daily use (01/2018), Anemia, Hypertension, and Tobacco abuse (01/2018).  has a past surgical history that includes Tonsillectomy (1970); Wrist surgery; and Intubation-endotracheal with tracheostomy standby (N/A, 06/17/2018). Prior to Admission medications   Medication Sig Start Date End Date Taking? Authorizing Provider  famotidine (PEPCID) 20 MG tablet Take 1 tablet (20 mg total) by mouth 2 (two) times daily. Patient not taking: Reported on 06/17/2018 02/13/18   Domenick Gong, MD  loratadine (CLARITIN) 10 MG tablet Take 1 tablet (10 mg total) by mouth daily. Patient not taking: Reported on 06/17/2018 02/13/18   Domenick Gong, MD  predniSONE (STERAPRED UNI-PAK 21 TAB) 10 MG (21) TBPK tablet Dispense one 6 day pack. Take as directed with food. Patient not taking: Reported on 06/17/2018 02/13/18   Domenick Gong, MD   Allergies  Allergen Reactions  . Lisinopril Swelling    FAMILY HISTORY:  family history is not on file. SOCIAL HISTORY:  reports that he has been smoking cigarettes. He has a 40.00 pack-year smoking history. He has never used smokeless tobacco. He reports that he drinks about 4.0 - 8.0 standard drinks of alcohol per week. He reports that he has current or past drug history. Drug: Marijuana.  REVIEW OF SYSTEMS:   Unable to obtain due to critical illness   VITAL SIGNS: Temp:  [97.6 F (36.4 C)-98.1 F (36.7 C)] 97.9 F (36.6 C) (09/06 0800) Pulse Rate:  [52-109] 109 (09/06 1100) Resp:  [14-25] 25 (09/06 1100) BP: (102-156)/(62-83) 150/83 (09/06 1100) SpO2:  [93 %-99 %] 94 % (09/06 1100) FiO2 (%):  [40 %] 40 % (09/06 1109) Weight:  [64 kg] 64 kg (09/06 0239)  Physical Examination:  Sedated with  propofol + fentanyl to RASS -4 On vent, no distress, improved swelling of the face lips and tongue.  Bilateral equal air entry and no adventitious sounds S1 & S2 are audible with no murmur Benign abdominal exam with feeble peristalsis Within normal extremities and no peripheral edema  ASSESSMENT / PLAN:  Acute respiratory failure intubated for airway protection -Optimize sedation, monitor airway and continuous ventilator support  Angioedema likely due to lisinopril.  improved swelling of lips, face, tongue -Steroid + H2-blocker   Atelectasis and possible aspiration pneumonia. improved bibasilar airspace disease on the chest x-ray.  MRSA PCR positive -Empiric Zosyn + vancomycin.  Monitor CXR + CBC + FiO2 requirement -Consider de-escalation of antimicrobial (d/c Vanc with culture results,  clinical improvement and clearing chest x-ray)  Hypotension with meds (improved) -Optimize meds, volume, off pressors and monitor hemodynamics  Prerenal azotemia with intravascular volume depletion -Optimize hydration, monitor renal panel and urine output  Chronic hyponatremia with potomania and volume depletion -Optimize hydration and monitor electrolytes  History of alcohol abuse -Librium + thiamine + folic acid and watch for DTs  Anemia -Keep hemoglobin more than 7 g/dL  Full code  DVT & GI prophylaxis.  Continue with supportive care  Critical care time 35 minutes

## 2018-06-19 NOTE — Anesthesia Postprocedure Evaluation (Signed)
Anesthesia Post Note  Patient: Jack Nicholson  Procedure(s) Performed: INTUBATION (N/A )  Anesthesia Type: General     Last Vitals:  Vitals:   06/19/18 0500 06/19/18 0600  BP: 113/67 113/68  Pulse: 61 66  Resp: 17 16  Temp:    SpO2: 97% 97%    Last Pain:  Vitals:   06/19/18 0430  TempSrc: Oral  PainSc:                  [name]

## 2018-06-20 ENCOUNTER — Inpatient Hospital Stay: Payer: PRIVATE HEALTH INSURANCE

## 2018-06-20 LAB — CBC WITH DIFFERENTIAL/PLATELET
BASOS ABS: 0 10*3/uL (ref 0–0.1)
BASOS PCT: 0 %
EOS ABS: 0 10*3/uL (ref 0–0.7)
Eosinophils Relative: 0 %
HEMATOCRIT: 34.9 % — AB (ref 40.0–52.0)
HEMOGLOBIN: 12.1 g/dL — AB (ref 13.0–18.0)
Lymphocytes Relative: 6 %
Lymphs Abs: 0.5 10*3/uL — ABNORMAL LOW (ref 1.0–3.6)
MCH: 35.5 pg — ABNORMAL HIGH (ref 26.0–34.0)
MCHC: 34.7 g/dL (ref 32.0–36.0)
MCV: 102.3 fL — AB (ref 80.0–100.0)
MONOS PCT: 5 %
Monocytes Absolute: 0.4 10*3/uL (ref 0.2–1.0)
NEUTROS ABS: 7.4 10*3/uL — AB (ref 1.4–6.5)
NEUTROS PCT: 89 %
PLATELETS: 184 10*3/uL (ref 150–440)
RBC: 3.41 MIL/uL — AB (ref 4.40–5.90)
RDW: 14.8 % — ABNORMAL HIGH (ref 11.5–14.5)
WBC: 8.4 10*3/uL (ref 3.8–10.6)

## 2018-06-20 LAB — VANCOMYCIN, TROUGH: Vancomycin Tr: 38 ug/mL (ref 15–20)

## 2018-06-20 LAB — GLUCOSE, CAPILLARY
GLUCOSE-CAPILLARY: 110 mg/dL — AB (ref 70–99)
GLUCOSE-CAPILLARY: 124 mg/dL — AB (ref 70–99)
GLUCOSE-CAPILLARY: 91 mg/dL (ref 70–99)
Glucose-Capillary: 139 mg/dL — ABNORMAL HIGH (ref 70–99)
Glucose-Capillary: 85 mg/dL (ref 70–99)
Glucose-Capillary: 90 mg/dL (ref 70–99)

## 2018-06-20 LAB — BASIC METABOLIC PANEL
ANION GAP: 7 (ref 5–15)
BUN: 22 mg/dL — AB (ref 6–20)
CHLORIDE: 103 mmol/L (ref 98–111)
CO2: 27 mmol/L (ref 22–32)
Calcium: 7.8 mg/dL — ABNORMAL LOW (ref 8.9–10.3)
Creatinine, Ser: 0.93 mg/dL (ref 0.61–1.24)
Glucose, Bld: 125 mg/dL — ABNORMAL HIGH (ref 70–99)
POTASSIUM: 3.8 mmol/L (ref 3.5–5.1)
SODIUM: 137 mmol/L (ref 135–145)

## 2018-06-20 LAB — MAGNESIUM: MAGNESIUM: 2.2 mg/dL (ref 1.7–2.4)

## 2018-06-20 LAB — PHOSPHORUS: PHOSPHORUS: 2.2 mg/dL — AB (ref 2.5–4.6)

## 2018-06-20 LAB — CALCIUM, IONIZED: CALCIUM, IONIZED, SERUM: 4.2 mg/dL — AB (ref 4.5–5.6)

## 2018-06-20 MED ORDER — SODIUM CHLORIDE 0.9 % IV SOLN
INTRAVENOUS | Status: DC
  Administered 2018-06-20 – 2018-06-21 (×2): via INTRAVENOUS

## 2018-06-20 MED ORDER — K PHOS MONO-SOD PHOS DI & MONO 155-852-130 MG PO TABS
250.0000 mg | ORAL_TABLET | ORAL | Status: AC
  Filled 2018-06-20 (×2): qty 1

## 2018-06-20 MED ORDER — FAMOTIDINE 20 MG PO TABS
20.0000 mg | ORAL_TABLET | Freq: Every day | ORAL | Status: DC
  Administered 2018-06-20 – 2018-06-21 (×2): 20 mg
  Filled 2018-06-20 (×2): qty 1

## 2018-06-20 MED ORDER — DEXMEDETOMIDINE HCL IN NACL 400 MCG/100ML IV SOLN
0.4000 ug/kg/h | INTRAVENOUS | Status: DC
  Administered 2018-06-20 (×2): 1.2 ug/kg/h via INTRAVENOUS
  Administered 2018-06-20: 0.5 ug/kg/h via INTRAVENOUS
  Administered 2018-06-21: 1.2 ug/kg/h via INTRAVENOUS
  Administered 2018-06-21: 1.199 ug/kg/h via INTRAVENOUS
  Administered 2018-06-21 (×2): 0.8 ug/kg/h via INTRAVENOUS
  Administered 2018-06-22: 0.6 ug/kg/h via INTRAVENOUS
  Filled 2018-06-20 (×8): qty 100

## 2018-06-20 MED ORDER — CHLORDIAZEPOXIDE HCL 25 MG PO CAPS
25.0000 mg | ORAL_CAPSULE | Freq: Three times a day (TID) | ORAL | Status: DC
  Administered 2018-06-20 – 2018-06-24 (×11): 25 mg
  Filled 2018-06-20 (×11): qty 1

## 2018-06-20 MED ORDER — FENTANYL CITRATE (PF) 100 MCG/2ML IJ SOLN
12.5000 ug | INTRAMUSCULAR | Status: DC | PRN
  Administered 2018-06-20: 12.5 ug via INTRAVENOUS
  Filled 2018-06-20: qty 2

## 2018-06-20 MED ORDER — FREE WATER: 200.0000 mL | Freq: Three times a day (TID) | Status: DC

## 2018-06-20 NOTE — Progress Notes (Signed)
Pharmacy Antibiotic Note  Jack Nicholson is a 57 y.o. male admitted on 06/17/2018 with angioedema. Per rounds with MD, concern for possible aspiration pneumonia. Pharmacy has been consulted for vancomycin and Zosyn dosing.  Plan: Continue Zosyn 3.375 g IV extended infusion q8h Continue vancomycin 750 mg IV q12h. Goal trough 15-20 mcg/mL. Trough ordered for 9/7 @ 0530  9/7 AM vanc apparently not drawn before dose given. Rescheduled for 1730.  Per CCM, will consider discontinuing vanc when cultures come back   Height: 5\' 11"  (180.3 cm) Weight: 144 lb 13.5 oz (65.7 kg) IBW/kg (Calculated) : 75.3  Temp (24hrs), Avg:98.2 F (36.8 C), Min:97.6 F (36.4 C), Max:98.8 F (37.1 C)  Recent Labs  Lab 06/17/18 1946 06/17/18 2101 06/17/18 2335 06/18/18 0306 06/19/18 0609  WBC 11.8*  --   --  6.8 14.9*  CREATININE 1.28*  --   --  1.08 1.06  LATICACIDVEN  --  1.3 1.0  --   --     Estimated Creatinine Clearance: 72.3 mL/min (by C-G formula based on SCr of 1.06 mg/dL).    Allergies  Allergen Reactions  . Lisinopril Swelling    Antimicrobials this admission: Vancomycin 9/5 >> Zosyn 9/5 >>  Dose adjustments this admission: N/A  Microbiology results: 9/6 Blood Cx: pending 9/6 Respiratory Cx: pending 9/4 MRSA PCR: positive  Thank you for allowing pharmacy to be a part of this patient's care.  Erich Montane, PharmD Pharmacy Resident  06/20/2018 6:29 AM

## 2018-06-20 NOTE — Progress Notes (Signed)
   Name: Jack Nicholson MRN: [id number] DOB: 10/07/1961     CONSULTATION DATE: 06/17/2018  Subjective & objective: Patient remains on ventilator on propofol + fentanyl for sedation.  Agitated was tapering sedation. PAST MEDICAL HISTORY :   has a past medical history of Alcohol abuse, daily use (01/2018), Anemia, Hypertension, and Tobacco abuse (01/2018).  has a past surgical history that includes Tonsillectomy (1970); Wrist surgery; and Intubation-endotracheal with tracheostomy standby (N/A, 06/17/2018). Prior to Admission medications   Medication Sig Start Date End Date Taking? Authorizing Provider  famotidine (PEPCID) 20 MG tablet Take 1 tablet (20 mg total) by mouth 2 (two) times daily. Patient not taking: Reported on 06/17/2018 02/13/18   Domenick Gong, MD  loratadine (CLARITIN) 10 MG tablet Take 1 tablet (10 mg total) by mouth daily. Patient not taking: Reported on 06/17/2018 02/13/18   Domenick Gong, MD  predniSONE (STERAPRED UNI-PAK 21 TAB) 10 MG (21) TBPK tablet Dispense one 6 day pack. Take as directed with food. Patient not taking: Reported on 06/17/2018 02/13/18   Domenick Gong, MD   Allergies  Allergen Reactions  . Lisinopril Swelling    FAMILY HISTORY:  family history is not on file. SOCIAL HISTORY:  reports that he has been smoking cigarettes. He has a 40.00 pack-year smoking history. He has never used smokeless tobacco. He reports that he drinks about 4.0 - 8.0 standard drinks of alcohol per week. He reports that he has current or past drug history. Drug: Marijuana.  REVIEW OF SYSTEMS:   Unable to obtain due to critical illness   VITAL SIGNS: Temp:  [97.6 F (36.4 C)-98.8 F (37.1 C)] 98.5 F (36.9 C) (09/07 0400) Pulse Rate:  [50-109] 55 (09/07 0700) Resp:  [15-25] 16 (09/07 0700) BP: (105-156)/(63-83) 122/75 (09/07 0700) SpO2:  [88 %-97 %] 96 % (09/07 0800) FiO2 (%):  [35 %-40 %] 35 % (09/07 0800) Weight:  [65.7 kg] 65.7 kg (09/07 0500) Physical  Examination: Sedated with propofol + fentanyl to RASS-4.  Agitation with tapering sedation On vent, no distress, improved swelling of the face lips and tongue. Bilateral equal air entry and no adventitious sounds.positive air leak  S1 & S2 are audible with no murmur Benign abdominal exam with feeble peristalsis Within normal extremities and no peripheral edema  ASSESSMENT / PLAN:  Acute respiratory failure intubated for airway protection.  Positive air leak -SAT + SBT as tolerated and assess for weaning -Optimize sedation (start on Precedex and wean off propofol for possible call withdrawal in process of weaning off mechanical ventilation), monitor airway and continuous ventilator support  Angioedema likely due to lisinopril.improved swelling of lips, face, tongue -Steroid + H2-blocker   Atelectasis and possible aspiration pneumonia.   Worsening bibasilar airspace disease and mild pulmonary congestion on the chest x-ray.MRSA PCR positive.  Respiratory culture GNR and GPC -Empiric Zosyn + vancomycin. Monitor CXR + CBC + FiO2 requirement -Optimize volume   Hypotension with meds (improved) -Optimize meds,volume, off pressors and monitor hemodynamics  Prerenal azotemia with intravascular volume depletion -Optimize hydration, monitor renal panel and urine output  Chronic hyponatremia with potomaniaand volume depletion -Optimize hydration and monitor electrolytes  History of alcohol abuse -Librium + thiamine + folic acid and watch for DTs  Anemia -Keep hemoglobin more than 7 g/dL  Hypophosphatemia -Replete and monitor electrolytes  Full code  DVT &GI prophylaxis. Continue with supportive care  Critical care time 35 minutes

## 2018-06-20 NOTE — Plan of Care (Signed)
Patient remains intubated.  Became agitated with patient care. Patient sedated most of shift. No discomfort or pain noted. Will continue to montor.

## 2018-06-20 NOTE — Progress Notes (Signed)
Pt following commands and passed SBT. Pt extubated to 3L o2 [place] with RT and MD at the bedside with no complications. Will continue to monitor.

## 2018-06-20 NOTE — Progress Notes (Signed)
Pharmacy Antibiotic Note  Jack Nicholson is a 57 y.o. male admitted on 06/17/2018 with angioedema. Per rounds with MD, concern for possible aspiration pneumonia. The patient has since been extubated. Pharmacy has been consulted for vancomycin and Zosyn dosing. This is day #3 of IV antibiotics on the following doses:  Zosyn 3.375 g IV extended infusion q8h vancomycin 750 mg IV q12h.  Vt at 1821  = 38.0 mcg/mL. Vancomycin 1800 scheduled dose had already been administered when I got the message for this level.  Plan: I have discontinued the vancomycin and ordered another level in 24 hours   Height: 5\' 11"  (180.3 cm) Weight: 144 lb 13.5 oz (65.7 kg) IBW/kg (Calculated) : 75.3  Temp (24hrs), Avg:98.3 F (36.8 C), Min:97.6 F (36.4 C), Max:98.8 F (37.1 C)  Recent Labs  Lab 06/17/18 1946 06/17/18 2101 06/17/18 2335 06/18/18 0306 06/19/18 0609 06/20/18 0609  WBC 11.8*  --   --  6.8 14.9* 8.4  CREATININE 1.28*  --   --  1.08 1.06 0.93  LATICACIDVEN  --  1.3 1.0  --   --   --     Estimated Creatinine Clearance: 82.4 mL/min (by C-G formula based on SCr of 0.93 mg/dL).    Allergies  Allergen Reactions  . Lisinopril Swelling    Antimicrobials this admission: Vancomycin 9/5 >> Zosyn 9/5 >>  Dose adjustments this admission: N/A  Microbiology results: 9/6 Blood Cx: pending 9/6 Respiratory Cx: pending 9/4 MRSA PCR: positive  Thank you for allowing pharmacy to be a part of this patient's care.  Lowella Bandy, PharmD 06/20/2018 11:06 AM

## 2018-06-20 NOTE — Progress Notes (Signed)
Patient extubated to 3L nasal cannula per Dr order. No complications with the extubation, no stridor present. Patient oxygen saturation 93% on 3L.

## 2018-06-20 NOTE — Progress Notes (Signed)
Pharmacy Antibiotic Note  Jack Nicholson is a 57 y.o. male admitted on 06/17/2018 with angioedema. Per rounds with MD, concern for possible aspiration pneumonia. Pharmacy has been consulted for vancomycin and Zosyn dosing.  Plan: Continue Zosyn 3.375 g IV extended infusion q8h Continue vancomycin 750 mg IV q12h. Goal trough 15-20 mcg/mL. Trough ordered for 9/7 @ 0530  9/7 AM vanc apparently not drawn before dose given. Rescheduled for 1730.  Addendum - labs drawn at 0609 with vancomycin level resulting at 37 (drawn after dose given). Trough already retimed as above. Doses due 0600 and 1800  Per CCM, will consider discontinuing vanc when cultures come back   Height: 5\' 11"  (180.3 cm) Weight: 144 lb 13.5 oz (65.7 kg) IBW/kg (Calculated) : 75.3  Temp (24hrs), Avg:98.2 F (36.8 C), Min:97.6 F (36.4 C), Max:98.8 F (37.1 C)  Recent Labs  Lab 06/17/18 1946 06/17/18 2101 06/17/18 2335 06/18/18 0306 06/19/18 0609 06/20/18 0609  WBC 11.8*  --   --  6.8 14.9* 8.4  CREATININE 1.28*  --   --  1.08 1.06  --   LATICACIDVEN  --  1.3 1.0  --   --   --     Estimated Creatinine Clearance: 72.3 mL/min (by C-G formula based on SCr of 1.06 mg/dL).    Allergies  Allergen Reactions  . Lisinopril Swelling    Antimicrobials this admission: Vancomycin 9/5 >> Zosyn 9/5 >>  Dose adjustments this admission: N/A  Microbiology results: 9/6 Blood Cx: pending 9/6 Respiratory Cx: pending 9/4 MRSA PCR: positive  Thank you for allowing pharmacy to be a part of this patient's care.  Marty Heck, PharmD 06/20/2018 7:22 AM

## 2018-06-21 ENCOUNTER — Inpatient Hospital Stay: Payer: PRIVATE HEALTH INSURANCE

## 2018-06-21 LAB — GLUCOSE, CAPILLARY
GLUCOSE-CAPILLARY: 86 mg/dL (ref 70–99)
GLUCOSE-CAPILLARY: 78 mg/dL (ref 70–99)

## 2018-06-21 LAB — COMPREHENSIVE METABOLIC PANEL
ALBUMIN: 2.9 g/dL — AB (ref 3.5–5.0)
ALT: 15 U/L (ref 0–44)
ANION GAP: 13 (ref 5–15)
AST: 27 U/L (ref 15–41)
Alkaline Phosphatase: 51 U/L (ref 38–126)
BILIRUBIN TOTAL: 1.1 mg/dL (ref 0.3–1.2)
BUN: 13 mg/dL (ref 6–20)
CALCIUM: 8 mg/dL — AB (ref 8.9–10.3)
CO2: 27 mmol/L (ref 22–32)
Chloride: 96 mmol/L — ABNORMAL LOW (ref 98–111)
Creatinine, Ser: 0.84 mg/dL (ref 0.61–1.24)
Glucose, Bld: 103 mg/dL — ABNORMAL HIGH (ref 70–99)
Potassium: 2.7 mmol/L — CL (ref 3.5–5.1)
Sodium: 136 mmol/L (ref 135–145)
TOTAL PROTEIN: 6.3 g/dL — AB (ref 6.5–8.1)

## 2018-06-21 LAB — BASIC METABOLIC PANEL
Anion gap: 13 (ref 5–15)
BUN: 18 mg/dL (ref 6–20)
CHLORIDE: 100 mmol/L (ref 98–111)
CO2: 27 mmol/L (ref 22–32)
Calcium: 8.5 mg/dL — ABNORMAL LOW (ref 8.9–10.3)
Creatinine, Ser: 0.8 mg/dL (ref 0.61–1.24)
GFR calc Af Amer: >60 mL/min (ref >60)
GFR calc non Af Amer: >60 mL/min (ref >60)
Glucose, Bld: 85 mg/dL (ref 70–99)
POTASSIUM: 2.8 mmol/L — AB (ref 3.5–5.1)
SODIUM: 140 mmol/L (ref 135–145)

## 2018-06-21 LAB — CBC WITH DIFFERENTIAL/PLATELET
BASOS ABS: 0 10*3/uL (ref 0–0.1)
BASOS PCT: 0 %
Eosinophils Absolute: 0.1 10*3/uL (ref 0–0.7)
Eosinophils Relative: 1 %
HEMATOCRIT: 41.9 % (ref 40.0–52.0)
HEMOGLOBIN: 14.3 g/dL (ref 13.0–18.0)
Lymphocytes Relative: 12 %
Lymphs Abs: 1.5 10*3/uL (ref 1.0–3.6)
MCH: 34.3 pg — ABNORMAL HIGH (ref 26.0–34.0)
MCHC: 34.2 g/dL (ref 32.0–36.0)
MCV: 100.3 fL — ABNORMAL HIGH (ref 80.0–100.0)
MONOS PCT: 10 %
Monocytes Absolute: 1.2 10*3/uL — ABNORMAL HIGH (ref 0.2–1.0)
NEUTROS ABS: 9.6 10*3/uL — AB (ref 1.4–6.5)
NEUTROS PCT: 77 %
Platelets: 210 10*3/uL (ref 150–440)
RBC: 4.18 MIL/uL — AB (ref 4.40–5.90)
RDW: 14.7 % — ABNORMAL HIGH (ref 11.5–14.5)
WBC: 12.5 10*3/uL — ABNORMAL HIGH (ref 3.8–10.6)

## 2018-06-21 LAB — PHOSPHORUS
PHOSPHORUS: 1.7 mg/dL — AB (ref 2.5–4.6)
PHOSPHORUS: 3.5 mg/dL (ref 2.5–4.6)
Phosphorus: 4.3 mg/dL (ref 2.5–4.6)

## 2018-06-21 LAB — CULTURE, RESPIRATORY

## 2018-06-21 LAB — MAGNESIUM
MAGNESIUM: 1.5 mg/dL — AB (ref 1.7–2.4)
Magnesium: 1.7 mg/dL (ref 1.7–2.4)

## 2018-06-21 LAB — VANCOMYCIN, TROUGH: Vancomycin Tr: 7 ug/mL — ABNORMAL LOW (ref 15–20)

## 2018-06-21 LAB — CULTURE, RESPIRATORY W GRAM STAIN

## 2018-06-21 LAB — POTASSIUM: POTASSIUM: 2.7 mmol/L — AB (ref 3.5–5.1)

## 2018-06-21 MED ORDER — HYDRALAZINE HCL 20 MG/ML IJ SOLN
10.0000 mg | INTRAMUSCULAR | Status: DC | PRN
  Administered 2018-06-21 (×2): 10 mg via INTRAVENOUS
  Filled 2018-06-21 (×2): qty 1

## 2018-06-21 MED ORDER — POTASSIUM PHOSPHATES 15 MMOLE/5ML IV SOLN
20.0000 mmol | Freq: Once | INTRAVENOUS | Status: AC
  Administered 2018-06-21: 20 mmol via INTRAVENOUS
  Filled 2018-06-21: qty 6.67

## 2018-06-21 MED ORDER — VANCOMYCIN HCL IN DEXTROSE 1-5 GM/200ML-% IV SOLN
1000.0000 mg | Freq: Two times a day (BID) | INTRAVENOUS | Status: DC
  Administered 2018-06-22: 1000 mg via INTRAVENOUS
  Filled 2018-06-21 (×2): qty 200

## 2018-06-21 MED ORDER — LORAZEPAM 2 MG/ML IJ SOLN: 1.0000 mg | INTRAMUSCULAR | Status: DC | PRN

## 2018-06-21 MED ORDER — VANCOMYCIN HCL 10 G IV SOLR
1250.0000 mg | Freq: Once | INTRAVENOUS | Status: AC
  Administered 2018-06-21: 1250 mg via INTRAVENOUS
  Filled 2018-06-21: qty 1250

## 2018-06-21 MED ORDER — CLONIDINE HCL 0.1 MG/24HR TD PTWK
0.1000 mg | MEDICATED_PATCH | TRANSDERMAL | Status: DC
  Administered 2018-06-21: 0.1 mg via TRANSDERMAL
  Filled 2018-06-21: qty 1

## 2018-06-21 NOTE — Progress Notes (Signed)
Pharmacy Antibiotic Note  Jack Nicholson is a 57 y.o. male admitted on 06/17/2018 with angioedema. Per rounds with MD, concern for possible aspiration pneumonia. The patient has since been extubated. Pharmacy has been consulted for vancomycin and Zosyn dosing. This is day #3 of IV antibiotics on the following doses:  Zosyn 3.375 g IV extended infusion q8h vancomycin 750 mg IV q12h.  9/7: Vt at 1821  = 38.0 mcg/mL. Vancomycin 1800 scheduled dose had already been administered when I got the message for this level.  Plan: I have discontinued the vancomycin and ordered another level in 24 hours   Height: 5\' 11"  (180.3 cm) Weight: 133 lb 9.6 oz (60.6 kg) IBW/kg (Calculated) : 75.3  Temp (24hrs), Avg:98.5 F (36.9 C), Min:97.9 F (36.6 C), Max:98.9 F (37.2 C)  Recent Labs  Lab 06/17/18 1946 06/17/18 2101 06/17/18 2335 06/18/18 0306 06/19/18 0609 06/20/18 0609 06/20/18 1818 06/21/18 0530  WBC 11.8*  --   --  6.8 14.9* 8.4  --  12.5*  CREATININE 1.28*  --   --  1.08 1.06 0.93  --  0.80  LATICACIDVEN  --  1.3 1.0  --   --   --   --   --   VANCOTROUGH  --   --   --   --   --   --  38*  --     Estimated Creatinine Clearance: 88.4 mL/min (by C-G formula based on SCr of 0.8 mg/dL).    Allergies  Allergen Reactions  . Lisinopril Swelling    Antimicrobials this admission: Vancomycin 9/5 >> Zosyn 9/5 >>  Dose adjustments this admission: N/A  Microbiology results: 9/6 Blood Cx: pending 9/6 Respiratory Cx: pending 9/4 MRSA PCR: positive  Thank you for allowing pharmacy to be a part of this patient's care.  Nyron Mozer A, PharmD 06/21/2018 9:46 AM

## 2018-06-21 NOTE — Progress Notes (Addendum)
   Name: Jack Nicholson MRN: [id number] DOB: 11-19-1960     CONSULTATION DATE: 06/17/2018  Subjective & objective: Patient was extubated yesterday and tolerating nasal cannula.  Confused with DTs on Precedex drip  PAST MEDICAL HISTORY :   has a past medical history of Alcohol abuse, daily use (01/2018), Anemia, Hypertension, and Tobacco abuse (01/2018).  has a past surgical history that includes Tonsillectomy (1970); Wrist surgery; and Intubation-endotracheal with tracheostomy standby (N/A, 06/17/2018). Prior to Admission medications   Medication Sig Start Date End Date Taking? Authorizing Provider  famotidine (PEPCID) 20 MG tablet Take 1 tablet (20 mg total) by mouth 2 (two) times daily. Patient not taking: Reported on 06/17/2018 02/13/18   Domenick Gong, MD  loratadine (CLARITIN) 10 MG tablet Take 1 tablet (10 mg total) by mouth daily. Patient not taking: Reported on 06/17/2018 02/13/18   Domenick Gong, MD  predniSONE (STERAPRED UNI-PAK 21 TAB) 10 MG (21) TBPK tablet Dispense one 6 day pack. Take as directed with food. Patient not taking: Reported on 06/17/2018 02/13/18   Domenick Gong, MD   Allergies  Allergen Reactions  . Lisinopril Swelling    FAMILY HISTORY:  family history is not on file. SOCIAL HISTORY:  reports that he has been smoking cigarettes. He has a 40.00 pack-year smoking history. He has never used smokeless tobacco. He reports that he drinks about 4.0 - 8.0 standard drinks of alcohol per week. He reports that he has current or past drug history. Drug: Marijuana.  REVIEW OF SYSTEMS:   Unable to obtain due to critical illness   VITAL SIGNS: Temp:  [97.9 F (36.6 C)-98.9 F (37.2 C)] 98.3 F (36.8 C) (09/08 0400) Pulse Rate:  [70-83] 74 (09/08 0600) Resp:  [11-32] 28 (09/08 0600) BP: (154-185)/(75-98) 182/87 (09/08 1023) SpO2:  [76 %-100 %] 100 % (09/08 0600) FiO2 (%):  [45 %] 45 % (09/08 0300) Weight:  [60.6 kg] 60.6 kg (09/08 0259)  Physical  Examination: On Precedex and drip, confused no focal motor deficit On nasal cannula, no distress, able to talk in full sentences. Bilateral equal air entry and no adventitious sounds.positive air leak  S1 & S2 are audible with no murmur Benign abdominal exam with feeble peristalsis Within normal extremities and no peripheral edema  ASSESSMENT / PLAN:  Acute respiratory failure intubated for airway protection.    Intubated on 06/21/2018 and tolerating nasal cannula  DTs with history of alcohol abuse -CIWA and Precedex   Angioedema likely due to lisinopril.  ( resolved)  -DC steroids + antihistamine + H2-blockers  Atelectasis and possible aspiration pneumonia.  Worseningbibasilar airspace disease and mild pulmonary congestion on the chest x-ray.MRSA PCR positive.  Respiratory culture GNR and GPC -Empiric Zosyn + vancomycin. Monitor CXR + CBC + FiO2 requirement -Optimize volume   Hypotension with meds(improved) -Optimize meds,volume,offpressors and monitor hemodynamics  Prerenal azotemia with intravascular volume depletion -Optimize hydration, monitor renal panel and urine output  Chronic hyponatremia with potomaniaand volume depletion -Optimize hydration and monitor electrolytes    Anemia -Keep hemoglobin more than 7 g/dL  Hypophosphatemia and hypokalemia -Replete and monitor electrolytes  Full code  DVT &GI prophylaxis. Continue with supportive care  Critical care time 35 minutes

## 2018-06-21 NOTE — Progress Notes (Signed)
Pharmacy Antibiotic Note  Jack Nicholson is a 57 y.o. male admitted on 06/17/2018 with angioedema. Per rounds with MD, concern for possible aspiration pneumonia. The patient has since been extubated. Pharmacy has been consulted for vancomycin and Zosyn dosing. This is day #4 of IV antibiotics on the following doses:  Zosyn 3.375 g IV extended infusion q8h vancomycin 750 mg IV q12h.  09/07: Vt at 1821  = 38.0 mcg/mL. Vancomycin was being administered while this level was being drawn 09/08: Vt at 1803 = 7.0 mcg/mL.   Plan: Based on this level and his improved renal function (also a missed dose this morning) I am going to re-load with 1250mg  then increase dose to 1000mg  every 12 hours, K 0.078, Vd: 43L, T1/2: 9h, calculated concentrations at steady-state: 40.9/17.4 mcg/mL, Vt prior to 4th dose   Height: 5\' 11"  (180.3 cm) Weight: 133 lb 9.6 oz (60.6 kg) IBW/kg (Calculated) : 75.3  Temp (24hrs), Avg:98.6 F (37 C), Min:97.9 F (36.6 C), Max:99.1 F (37.3 C)  Recent Labs  Lab 06/17/18 1946 06/17/18 2101 06/17/18 2335 06/18/18 0306 06/19/18 0609 06/20/18 0609 06/20/18 1818 06/21/18 0530 06/21/18 1803  WBC 11.8*  --   --  6.8 14.9* 8.4  --  12.5*  --   CREATININE 1.28*  --   --  1.08 1.06 0.93  --  0.80  --   LATICACIDVEN  --  1.3 1.0  --   --   --   --   --   --   VANCOTROUGH  --   --   --   --   --   --  38*  --  7*    Estimated Creatinine Clearance: 88.4 mL/min (by C-G formula based on SCr of 0.8 mg/dL).    Allergies  Allergen Reactions  . Lisinopril Swelling    Antimicrobials this admission: Vancomycin 9/5 >> Zosyn 9/5 >>  Dose adjustments this admission: N/A  Microbiology results: 9/6 Blood Cx: pending 9/6 Respiratory Cx: pending 9/4 MRSA PCR: positive  Thank you for allowing pharmacy to be a part of this patient's care.  Lowella Bandy, PharmD 06/21/2018 6:45 PM

## 2018-06-21 NOTE — Progress Notes (Signed)
Patient weaned from 4L nasal cannula to 2L nasal cannula, patient oxygen saturation 97% on 2L nasal cannula.

## 2018-06-21 NOTE — Progress Notes (Signed)
Patient weaned from 2L nasal cannula to room air. Patient oxygen saturation 95% on room air.

## 2018-06-21 NOTE — Plan of Care (Signed)
Patient on nasal canula at beginning of shift. Currently on non-rebreather. Patient continues to pull off mask. Insisted on calling wife at 3:00 am.  Some confusion noted as to how and why he is at [employer]. Patient needed much redirection to stay in bed and to not pull off his clothes. Patient able to cough and deep breathe when directed.  Good urine output.  Will continue to monitor.

## 2018-06-22 ENCOUNTER — Inpatient Hospital Stay: Payer: PRIVATE HEALTH INSURANCE

## 2018-06-22 DIAGNOSIS — E43 Unspecified severe protein-calorie malnutrition: Secondary | ICD-10-CM

## 2018-06-22 LAB — MAGNESIUM: MAGNESIUM: 2.3 mg/dL (ref 1.7–2.4)

## 2018-06-22 LAB — CBC WITH DIFFERENTIAL/PLATELET
BASOS ABS: 0 10*3/uL (ref 0–0.1)
BASOS PCT: 0 %
Eosinophils Absolute: 0.1 10*3/uL (ref 0–0.7)
Eosinophils Relative: 1 %
HEMATOCRIT: 41.1 % (ref 40.0–52.0)
HEMOGLOBIN: 14.3 g/dL (ref 13.0–18.0)
Lymphocytes Relative: 10 %
Lymphs Abs: 1.2 10*3/uL (ref 1.0–3.6)
MCH: 34.7 pg — ABNORMAL HIGH (ref 26.0–34.0)
MCHC: 34.9 g/dL (ref 32.0–36.0)
MCV: 99.5 fL (ref 80.0–100.0)
MONOS PCT: 9 %
Monocytes Absolute: 1.2 10*3/uL — ABNORMAL HIGH (ref 0.2–1.0)
NEUTROS ABS: 10.5 10*3/uL — AB (ref 1.4–6.5)
NEUTROS PCT: 80 %
Platelets: 221 10*3/uL (ref 150–440)
RBC: 4.13 MIL/uL — AB (ref 4.40–5.90)
RDW: 14.5 % (ref 11.5–14.5)
WBC: 13.1 10*3/uL — ABNORMAL HIGH (ref 3.8–10.6)

## 2018-06-22 LAB — BASIC METABOLIC PANEL
Anion gap: 12 (ref 5–15)
BUN: 12 mg/dL (ref 6–20)
CHLORIDE: 97 mmol/L — AB (ref 98–111)
CO2: 25 mmol/L (ref 22–32)
Calcium: 8.2 mg/dL — ABNORMAL LOW (ref 8.9–10.3)
Creatinine, Ser: 0.75 mg/dL (ref 0.61–1.24)
GFR calc Af Amer: >60 mL/min (ref >60)
GFR calc non Af Amer: >60 mL/min (ref >60)
GLUCOSE: 103 mg/dL — AB (ref 70–99)
POTASSIUM: 3.4 mmol/L — AB (ref 3.5–5.1)
Sodium: 134 mmol/L — ABNORMAL LOW (ref 135–145)

## 2018-06-22 LAB — PHOSPHORUS: Phosphorus: 3.2 mg/dL (ref 2.5–4.6)

## 2018-06-22 MED ORDER — POTASSIUM CHLORIDE 10 MEQ/100ML IV SOLN
10.0000 meq | INTRAVENOUS | Status: DC
  Filled 2018-06-22 (×4): qty 100

## 2018-06-22 MED ORDER — FOLIC ACID 1 MG PO TABS
1.0000 mg | ORAL_TABLET | Freq: Every day | ORAL | Status: DC
  Administered 2018-06-23 – 2018-06-25 (×3): 1 mg via ORAL
  Filled 2018-06-22 (×3): qty 1

## 2018-06-22 MED ORDER — POTASSIUM CHLORIDE 10 MEQ/100ML IV SOLN
10.0000 meq | INTRAVENOUS | Status: AC
  Administered 2018-06-22 (×6): 10 meq via INTRAVENOUS
  Filled 2018-06-22 (×6): qty 100

## 2018-06-22 MED ORDER — ADULT MULTIVITAMIN W/MINERALS CH
1.0000 | ORAL_TABLET | Freq: Every day | ORAL | Status: DC
  Administered 2018-06-23 – 2018-06-25 (×3): 1 via ORAL
  Filled 2018-06-22 (×3): qty 1

## 2018-06-22 MED ORDER — VITAMIN B-1 100 MG PO TABS
100.0000 mg | ORAL_TABLET | Freq: Every day | ORAL | Status: DC
  Administered 2018-06-23 – 2018-06-25 (×3): 100 mg via ORAL
  Filled 2018-06-22 (×3): qty 1

## 2018-06-22 MED ORDER — POTASSIUM CHLORIDE 20 MEQ PO PACK
60.0000 meq | PACK | Freq: Once | ORAL | Status: DC
  Filled 2018-06-22: qty 3

## 2018-06-22 MED ORDER — POTASSIUM CHLORIDE CRYS ER 20 MEQ PO TBCR
40.0000 meq | EXTENDED_RELEASE_TABLET | Freq: Once | ORAL | Status: AC
  Administered 2018-06-22: 40 meq via ORAL
  Filled 2018-06-22: qty 2

## 2018-06-22 MED ORDER — ENSURE ENLIVE PO LIQD
237.0000 mL | Freq: Three times a day (TID) | ORAL | Status: DC
  Administered 2018-06-22 – 2018-06-25 (×9): 237 mL via ORAL

## 2018-06-22 MED ORDER — SODIUM CHLORIDE 0.9 % IV SOLN
1.0000 g | Freq: Three times a day (TID) | INTRAVENOUS | Status: AC
  Administered 2018-06-22 – 2018-06-24 (×8): 1 g via INTRAVENOUS
  Filled 2018-06-22 (×11): qty 1

## 2018-06-22 MED ORDER — SODIUM CHLORIDE 0.9 % IV SOLN
1.0000 g | Freq: Three times a day (TID) | INTRAVENOUS | Status: DC
  Filled 2018-06-22 (×3): qty 1

## 2018-06-22 MED ORDER — MAGNESIUM SULFATE 4 GM/100ML IV SOLN
4.0000 g | Freq: Once | INTRAVENOUS | Status: AC
  Administered 2018-06-22: 4 g via INTRAVENOUS
  Filled 2018-06-22: qty 100

## 2018-06-22 NOTE — Progress Notes (Signed)
Pt being transferred to room 136. Report called to [place], Charity fundraiser. Pt and belongings transferred to room 136 without incident.

## 2018-06-22 NOTE — Progress Notes (Addendum)
Follow up - Critical Care Medicine Note  Patient Details:    Jack Nicholson is an 57 y.o. male. With a past medical history of chronic alcohol abuse, anemia, hypertension, tobacco use, status post an episode of angioedema, successfully extubated now with DTs.  Lines, Airways, Drains: Urethral Catheter Misty C. RN Latex 14 Fr. (Active)  Indication for Insertion or Continuance of Catheter Acute urinary retention 06/22/2018  2:00 AM  Site Assessment Clean;Intact;Dry 06/22/2018  2:00 AM  Catheter Maintenance Bag below level of bladder;Catheter secured;Drainage bag/tubing not touching floor;Insertion date on drainage bag;No dependent loops;Seal intact;Bag emptied prior to transport 06/22/2018  2:00 AM  Collection Container Standard drainage bag 06/22/2018  2:00 AM  Securement Method Securing device (Describe) 06/22/2018  2:00 AM  Urinary Catheter Interventions Unclamped 06/22/2018  2:00 AM  Output (mL) 365 mL 06/22/2018  5:38 AM    Anti-infectives:  Anti-infectives (From admission, onward)   Start     Dose/Rate Route Frequency Ordered Stop   06/22/18 0400  vancomycin (VANCOCIN) IVPB 1000 mg/200 mL premix     1,000 mg 200 mL/hr over 60 Minutes Intravenous Every 12 hours 06/21/18 1853     06/21/18 2000  vancomycin (VANCOCIN) 1,250 mg in sodium chloride 0.9 % 250 mL IVPB     1,250 mg 166.7 mL/hr over 90 Minutes Intravenous  Once 06/21/18 1853 06/21/18 2229   06/18/18 1800  vancomycin (VANCOCIN) IVPB 750 mg/150 ml premix  Status:  Discontinued     750 mg 150 mL/hr over 60 Minutes Intravenous Every 12 hours 06/18/18 1158 06/20/18 1853   06/18/18 1030  vancomycin (VANCOCIN) IVPB 1000 mg/200 mL premix     1,000 mg 200 mL/hr over 60 Minutes Intravenous  Once 06/18/18 0933 06/18/18 1242   06/18/18 1000  piperacillin-tazobactam (ZOSYN) IVPB 3.375 g     3.375 g 12.5 mL/hr over 240 Minutes Intravenous Every 8 hours 06/18/18 0933        Microbiology: Results for orders placed or performed during the  hospital encounter of 06/17/18  MRSA PCR Screening     Status: Abnormal   Collection Time: 06/17/18  7:09 PM  Result Value Ref Range Status   MRSA by PCR POSITIVE (A) NEGATIVE Final    Comment:        The GeneXpert MRSA Assay (FDA approved for NASAL specimens only), is one component of a comprehensive MRSA colonization surveillance program. It is not intended to diagnose MRSA infection nor to guide or monitor treatment for MRSA infections. CRITICAL RESULT CALLED TO, READ BACK BY AND VERIFIED WITH: C/ MISTY CAUSY @2200  06/17/18 [facility] Performed at [facility], [address] [place], [place] [address]   CULTURE, BLOOD (ROUTINE X 2) w Reflex to ID Panel     Status: None (Preliminary result)   Collection Time: 06/19/18  2:39 PM  Result Value Ref Range Status   Specimen Description BLOOD LT [facility]  Final   Special Requests   Final    BOTTLES DRAWN AEROBIC AND ANAEROBIC Blood Culture adequate volume   Culture   Final    NO GROWTH 3 DAYS Performed at [facility], [address], [place], [place] [address]    Report Status PENDING  Incomplete  CULTURE, BLOOD (ROUTINE X 2) w Reflex to ID Panel     Status: None (Preliminary result)   Collection Time: 06/19/18  2:39 PM  Result Value Ref Range Status   Specimen Description BLOOD RT HAND  Final   Special Requests   Final  BOTTLES DRAWN AEROBIC AND ANAEROBIC Blood Culture adequate volume   Culture   Final    NO GROWTH 3 DAYS Performed at [facility], [address] [place], [place] [address]    Report Status PENDING  Incomplete  Culture, respiratory (non-expectorated)     Status: None   Collection Time: 06/19/18  5:57 PM  Result Value Ref Range Status   Specimen Description   Final    TRACHEAL ASPIRATE Performed at [facility], [address], [place], [place] [address]    Special Requests   Final    NONE Performed at [facility], [address] [place], [place] [address]     Gram Stain   Final    FEW WBC PRESENT,BOTH PMN AND MONONUCLEAR MODERATE GRAM NEGATIVE RODS FEW GRAM POSITIVE COCCI IN PAIRS RARE SQUAMOUS EPITHELIAL CELLS PRESENT Performed at [facility], [address], [place], [place] [address]    Culture FEW ENTEROBACTER CLOACAE  Final   Report Status 06/21/2018 FINAL  Final   Organism ID, Bacteria ENTEROBACTER CLOACAE  Final      Susceptibility   Enterobacter cloacae - MIC*    CEFAZOLIN >=64 RESISTANT Resistant     CEFEPIME <=1 SENSITIVE Sensitive     CEFTAZIDIME <=1 SENSITIVE Sensitive     CEFTRIAXONE <=1 SENSITIVE Sensitive     CIPROFLOXACIN <=0.25 SENSITIVE Sensitive     GENTAMICIN <=1 SENSITIVE Sensitive     IMIPENEM <=0.25 SENSITIVE Sensitive     TRIMETH/SULFA <=20 SENSITIVE Sensitive     PIP/TAZO <=4 SENSITIVE Sensitive     * FEW ENTEROBACTER CLOACAE   Studies: Dg Abd 1 View  Result Date: 06/17/2018 CLINICAL DATA:  OG tube placement EXAM: ABDOMEN - 1 VIEW COMPARISON:  None. FINDINGS: Esophageal tube tip overlies the proximal stomach. Visible gas pattern nonobstructed IMPRESSION: Esophageal tube tip overlies the proximal stomach Electronically Signed   By: Jasmine Pang M.D.   On: 06/17/2018 19:53   US Renal  Result Date: 06/18/2018 CLINICAL DATA:  Acute renal failure. EXAM: RENAL / URINARY TRACT ULTRASOUND COMPLETE COMPARISON:  None. FINDINGS: Right Kidney: Length: 9.5 cm. Echogenicity within normal limits. No mass or hydronephrosis visualized. Left Kidney: Length: 10.1 cm. Echogenicity within normal limits. No mass or hydronephrosis visualized. Bladder: Foley catheter in the urinary bladder. The bladder is normally distended and has a normal appearance. IMPRESSION: 1. Foley catheter in the urinary bladder with normal distention of the bladder with urine. This could be due to the Foley clamped off or obstructed. 2. Normal appearing kidneys with no hydronephrosis. Electronically Signed   By: Beckie Salts M.D.   On: 06/18/2018 12:33    Dg Chest Port 1 View  Result Date: 06/21/2018 CLINICAL DATA:  Pneumonia. EXAM: PORTABLE CHEST 1 VIEW COMPARISON:  June 20, 2018 FINDINGS: No pneumothorax. The ET and NG tubes have been removed. New opacity in the left perihilar region. Mild focal opacity in the right mid lung. The heart, hila, and mediastinum are unchanged. A rounded density over the chest is likely on the patient, new in the interval. No other acute abnormalities. IMPRESSION: Bilateral pulmonary infiltrates, left greater than right, suspicious for multifocal pneumonia. Recommend follow-up to resolution. Electronically Signed   By: Gerome Sam III M.D   On: 06/21/2018 07:47   Dg Chest Port 1 View  Result Date: 06/20/2018 CLINICAL DATA:  Pneumonia. EXAM: PORTABLE CHEST 1 VIEW COMPARISON:  Chest x-ray from yesterday. FINDINGS: Unchanged positioning of the endotracheal tube tip 8.9 cm above  the carina. Unchanged enteric tube in the stomach. The heart size and mediastinal contours are within normal limits. Normal pulmonary vascularity. Increasing linear atelectasis at the right lung base. Unchanged mild left basilar airspace disease. No pleural effusion or pneumothorax. No acute osseous abnormality. IMPRESSION: 1. Unchanged positioning of the endotracheal tube tip 8.9 cm above the carina. Recommend advancement 3-4 cm. 2. Increasing linear atelectasis at the right lung base. Unchanged mild left basilar airspace disease. Electronically Signed   By: Obie Dredge M.D.   On: 06/20/2018 08:46   Dg Chest Port 1 View  Result Date: 06/19/2018 CLINICAL DATA:  Hypoxia EXAM: PORTABLE CHEST 1 VIEW COMPARISON:  June 18, 2018 FINDINGS: Endotracheal tube tip is 8.8 cm above the carina. Nasogastric tube tip and side port are in the stomach. No adenopathy. There is a persistent focal opacity in the left base, concerning for a focus of pneumonia with atelectatic change. Lungs elsewhere clear. Heart is upper normal in size with pulmonary  vascularity normal. No adenopathy. No bone lesions. IMPRESSION: Tube and catheter positions as described without pneumothorax. Advise advancing endotracheal tube approximately 4 cm. Persistent opacity left base, suspicious for focal pneumonia with atelectatic change. No new opacity evident. Stable cardiac silhouette. Electronically Signed   By: Bretta Bang III M.D.   On: 06/19/2018 07:10   Dg Chest Port 1 View  Result Date: 06/18/2018 CLINICAL DATA:  Acute respiratory failure EXAM: PORTABLE CHEST 1 VIEW COMPARISON:  06/17/2018 chest radiograph FINDINGS: An endotracheal tube with tip 6 cm above the carina and NG tube entering the stomach again noted. LEFT basilar opacity/atelectasis is slightly more prominent. No pleural effusion or pneumothorax identified. No other significant change. IMPRESSION: Slightly more prominent LEFT basilar opacity/atelectasis without other significant change. Electronically Signed   By: Harmon Pier M.D.   On: 06/18/2018 10:05   Dg Chest Port 1 View  Result Date: 06/17/2018 CLINICAL DATA:  ET and OG tube placement EXAM: PORTABLE CHEST 1 VIEW COMPARISON:  None. FINDINGS: Endotracheal tube tip is about 6.3 cm superior to the carina. Esophageal tube tip is in the left upper quadrant. Small focus of airspace disease in the left lower lung. Normal heart size. No pneumothorax. IMPRESSION: 1. Endotracheal tube tip about 6.3 cm superior to carina 2. Esophageal tube tip in the left upper quadrant 3. Patchy foci of inflammation or infection in the left lower lung. Electronically Signed   By: Jasmine Pang M.D.   On: 06/17/2018 19:53    Consults: Treatment Team:  Pccm, Raymond Gurney, MD   Subjective:    Overnight Issues: patient is presently on a Precedex infusion along with benzodiazepineolic acid, thiamine  Objective:  Vital signs for last 24 hours: Temp:  [98.6 F (37 C)-99.5 F (37.5 C)] 99.5 F (37.5 C) (09/09 0400) Pulse Rate:  [67-83] 74 (09/09 0600) Resp:  [21-36]  29 (09/09 0600) BP: (124-182)/(67-87) 124/71 (09/09 0600) SpO2:  [92 %-99 %] 93 % (09/09 0600)  Hemodynamic parameters for last 24 hours:    Intake/Output from previous day: 09/08 0701 - 09/09 0700 In: 2437.4 [I.V.:739.5; IV Piggyback:1698] Out: 3700 [Urine:3700]  Intake/Output this shift: No intake/output data recorded.  Physical Exam:   Vital signs: Please see the above listed vital signs HEENT trachea midline, no accessory muscle utilization, no oral lesions appreciated Cardiovascular: Regular rate and rhythm Pulmonary: Clear to auscultation Abdominal: Positive bowel sounds, soft exam Extremities: No clubbing, cyanosis or edema noted Neurologic: Patient is responsive but confused presently on Precedex, moves all extremities  Assessment/Plan:  Patient status post angioedema secondary to lisinopril, was intubated and subsequently successfully extubated. Patient grew Enterobacter in sputum, will DC vancomycin,will de-escalate Zosyn based on sensitivities. Chest x-ray reveals bilateral patchy airspace disease with some improvement on today's film  DTs. Patient with history of chronic alcohol use presently on Precedex infusion, thiamine, folic acid,CIWA protocol  Hypokalemia on replacement  Hypomagnesemia being replaced  Leukocytosis.on broad-spectrum coverage  Severe Malnutrition   Critical Care Total Time 35 minutes  Elinore Shults 06/22/2018  *Care during the described time interval was provided by me and/or other providers on the critical care team.  I have reviewed this patient's available data, including medical history, events of note, physical examination and test results as part of my evaluation.

## 2018-06-22 NOTE — Progress Notes (Signed)
Nutrition Follow-up  DOCUMENTATION CODES:   Severe malnutrition in context of chronic illness  INTERVENTION:  Provide Ensure Enlive po TID, each supplement provides 350 kcal and 20 grams of protein.  Recommend changing liquid MVI to tablet MVI PO. Recommend changing to thiamine 100 mg PO daily and folic acid 1 mg PO daily.  Can discontinue daily weights now that patient is no longer on tube feeds. Recommend measuring weight once weekly.  NUTRITION DIAGNOSIS:   Severe Malnutrition related to chronic illness(EtOH abuse) as evidenced by severe fat depletion, moderate muscle depletion, severe muscle depletion.  Ongoing.  GOAL:   Patient will meet greater than or equal to 90% of their needs  Progressing.  MONITOR:   PO intake, Supplement acceptance, Labs, Weight trends, I & O's  REASON FOR ASSESSMENT:   Ventilator    ASSESSMENT:   57 year old male with PMHx of EtOH abuse, tobacco abuse, macrocytic anemia (vitamin B12 deficiency), HTN admitted with angioedema likely due to lisinopril, required intubation on 9/4 for airway protection, atelectasis and possible aspiration PNA.   -Patient was extubated on 9/7. -Started regular diet on 9/8.  Patient sleeping on Precedex gtt at time of RD assessment. Unable to provide any history. He was started on a regular diet yesterday. No meal completion recorded in chart at this time. Patient was able to take some PO medications this AM.  Medications reviewed and include: folic acid 1 mg daily IV, MVI liquid daily per tube, thiamine 100 mg daily IV, potassium chloride 40 mEq once today PO, cefepime, Precedex gtt.  Labs reviewed: Sodium 134, Potassium 3.4, Chloride 97.  I/O: 3700 mL UOP yesterday (2.5 mL/kg/hr)  Weight trend: 60.6 kg on 9/8; +0.8 kg from admission wt  Discussed with RN and on rounds. Patient now with DTs. Coming down on Precedex gtt.  Diet Order:   Diet Order            Diet regular Room service appropriate? Yes;  Fluid consistency: Thin  Diet effective now              EDUCATION NEEDS:   No education needs have been identified at this time  Skin:  Skin Assessment: Reviewed RN Assessment  Last BM:  Unknown/PTA  Height:   Ht Readings from Last 1 Encounters:  06/17/18 5\' 11"  (1.803 m)    Weight:   Wt Readings from Last 1 Encounters:  06/21/18 60.6 kg    Ideal Body Weight:  78.2 kg  BMI:  Body mass index is 18.63 kg/m.  Estimated Nutritional Needs:   Kcal:  1770-2065 (MSJ x 1.2-1.4)  Protein:  93-112 grams (1.5-1.8 grams/kg)  Fluid:  1.2-1.5 L/day  Helane Rima, MS, RD, LDN Office: [phone] Pager: [phone] After Hours/Weekend Pager: [phone]

## 2018-06-22 NOTE — Progress Notes (Signed)
Pharmacy Electrolyte Monitoring Consult:  Pharmacy consulted to assist in monitoring and replacing electrolytes in this 57 y.o. male admitted on 06/17/2018 with angioedema  Labs:  Sodium (mmol/L)  Date Value  06/22/2018 134 (L)   Potassium (mmol/L)  Date Value  06/22/2018 3.4 (L)   Magnesium (mg/dL)  Date Value  92/08/9416 2.3   Phosphorus (mg/dL)  Date Value  40/81/4481 3.2   Calcium (mg/dL)  Date Value  85/63/1497 8.2 (L)   Albumin (g/dL)  Date Value  02/63/7858 2.9 (L)    Assessment/Plan: 9/8 @ 2322 K 2.7 and Mg 1.5. Patient received potassium 10 mEq IV x 6 and magnesium 4 g IV x 1. Recheck of electrolytes this am with K 3.4 and Mg 2.3. Will give potassium 40 mEq PO x 1 as patient can now tolerate oral meds. Will recheck electrolytes with morning labs.  Pharmacy to continue to follow patient and replace electrolytes per consult.  Pricilla Riffle, PharmD Pharmacy Resident  06/22/2018 11:25 AM

## 2018-06-23 LAB — CALCIUM, IONIZED
CALCIUM, IONIZED, SERUM: 4.8 mg/dL (ref 4.5–5.6)
Calcium, Ionized, Serum: 4.5 mg/dL (ref 4.5–5.6)
Calcium, Ionized, Serum: 4.7 mg/dL (ref 4.5–5.6)

## 2018-06-23 LAB — MAGNESIUM
MAGNESIUM: 2.1 mg/dL (ref 1.7–2.4)
MAGNESIUM: 2 mg/dL (ref 1.7–2.4)

## 2018-06-23 LAB — BASIC METABOLIC PANEL
Anion gap: 8 (ref 5–15)
BUN: 15 mg/dL (ref 6–20)
CALCIUM: 8.6 mg/dL — AB (ref 8.9–10.3)
CHLORIDE: 101 mmol/L (ref 98–111)
CO2: 30 mmol/L (ref 22–32)
CREATININE: 0.89 mg/dL (ref 0.61–1.24)
GFR calc non Af Amer: >60 mL/min (ref >60)
Glucose, Bld: 109 mg/dL — ABNORMAL HIGH (ref 70–99)
Potassium: 3.3 mmol/L — ABNORMAL LOW (ref 3.5–5.1)
SODIUM: 139 mmol/L (ref 135–145)

## 2018-06-23 MED ORDER — BUDESONIDE 0.5 MG/2ML IN SUSP
0.5000 mg | Freq: Two times a day (BID) | RESPIRATORY_TRACT | Status: DC
  Administered 2018-06-23 – 2018-06-25 (×5): 0.5 mg via RESPIRATORY_TRACT
  Filled 2018-06-23 (×5): qty 2

## 2018-06-23 MED ORDER — ALBUTEROL SULFATE (2.5 MG/3ML) 0.083% IN NEBU: 2.5000 mg | INHALATION_SOLUTION | RESPIRATORY_TRACT | Status: DC | PRN

## 2018-06-23 MED ORDER — IPRATROPIUM-ALBUTEROL 0.5-2.5 (3) MG/3ML IN SOLN
3.0000 mL | Freq: Two times a day (BID) | RESPIRATORY_TRACT | Status: DC
  Administered 2018-06-23 – 2018-06-25 (×5): 3 mL via RESPIRATORY_TRACT
  Filled 2018-06-23 (×5): qty 3

## 2018-06-23 MED ORDER — POTASSIUM CHLORIDE CRYS ER 20 MEQ PO TBCR
40.0000 meq | EXTENDED_RELEASE_TABLET | Freq: Once | ORAL | Status: AC
  Administered 2018-06-23: 40 meq via ORAL
  Filled 2018-06-23: qty 2

## 2018-06-23 MED ORDER — METOPROLOL TARTRATE 25 MG PO TABS
25.0000 mg | ORAL_TABLET | Freq: Two times a day (BID) | ORAL | Status: DC
  Administered 2018-06-23 – 2018-06-25 (×5): 25 mg via ORAL
  Filled 2018-06-23 (×5): qty 1

## 2018-06-23 MED ORDER — GUAIFENESIN ER 600 MG PO TB12
600.0000 mg | ORAL_TABLET | Freq: Two times a day (BID) | ORAL | Status: DC
  Administered 2018-06-23 – 2018-06-25 (×5): 600 mg via ORAL
  Filled 2018-06-23 (×5): qty 1

## 2018-06-23 MED ORDER — NICOTINE 14 MG/24HR TD PT24
14.0000 mg | MEDICATED_PATCH | Freq: Every day | TRANSDERMAL | Status: DC
  Administered 2018-06-23 – 2018-06-25 (×3): 14 mg via TRANSDERMAL
  Filled 2018-06-23 (×3): qty 1

## 2018-06-23 MED ORDER — POTASSIUM CHLORIDE 20 MEQ PO PACK
20.0000 meq | PACK | Freq: Every day | ORAL | Status: DC
  Administered 2018-06-24 – 2018-06-25 (×2): 20 meq via ORAL
  Filled 2018-06-23 (×2): qty 1

## 2018-06-23 NOTE — Clinical Social Work Note (Signed)
Clinical Social Work Assessment  Patient Details  Name: Jack Nicholson MRN: [id number] Date of Birth: 1960-11-24  Date of referral:  06/23/18               Reason for consult:  Facility Placement                Permission sought to share information with:  Chartered certified accountant granted to share information::  Yes, Verbal Permission Granted  Name::      [place]::   [place]   Relationship::     Contact Information:     Housing/Transportation Living arrangements for the past 2 months:    Source of Information:  Patient, Spouse Patient Interpreter Needed:  None Criminal Activity/Legal Involvement Pertinent to Current Situation/Hospitalization:  No - Comment as needed Significant Relationships:  Spouse Lives with:  Spouse Do you feel safe going back to the place where you live?  Yes Need for family participation in patient care:  Yes (Comment)  Care giving concerns:  Patient lives with his wife Jack Nicholson in [place].    Social Worker assessment / plan:  Holiday representative (CSW) received verbal consult from PT that recommendation is SNF. CSW met with patient and his wife Jack Nicholson was at bedside. Patient was alert and oriented X3 and was laying in the bed. CSW introduced self and explained role of CSW department. Per patient he lives in [place] with his wife and she works full time at [employer]. Per patient he does drink alcohol but has not had a drink in 1 week. Patient is interested in counseling resources. CSW provided list of outpatient substance abuse treatment options. CSW explained SNF process and that [place] will have to approve SNF. Patient is agreeable to SNF search in [facility]. FL2 complete and faxed out.   CSW presented only bed offer [facility]. CSW explained that [facility] is the only facility in [facility] in network with [employer]. Patient accepted bed offer.   Per [facility] admissions coordinator at  [facility] she received SNF authorization from [place]. Patient and his wife are aware of above. CSW will continue to follow and assist as needed.   Employment status:  Disabled (Comment on whether or not currently receiving Disability) Insurance information:  Managed Care PT Recommendations:  [place] / Referral to community resources:  [place]  Patient/Family's Response to care:  Patient is agreeable to D/C to [facility].   Patient/Family's Understanding of and Emotional Response to Diagnosis, Current Treatment, and Prognosis:  Patient and his wife were very pleasant and thanked CSW for assistance.   Emotional Assessment Appearance:  Appears stated age Attitude/Demeanor/Rapport:    Affect (typically observed):  Accepting, Adaptable, Pleasant Orientation:  Oriented to Self, Oriented to Place, Oriented to  Time, Oriented to Situation Alcohol / Substance use:  Not Applicable Psych involvement (Current and /or in the community):  No (Comment)  Discharge Needs  Concerns to be addressed:  Discharge Planning Concerns Readmission within the last 30 days:  No Current discharge risk:  Dependent with Mobility Barriers to Discharge:  Continued Medical Work up   [employer], [place] Beets, LCSW 06/23/2018, 4:14 PM

## 2018-06-23 NOTE — Progress Notes (Signed)
Family Meeting Note  Advance Directive:yes  Today a meeting took place with the Patient and spouse.  Patient is able to participate   The following clinical team members were present during this meeting:MD  The following were discussed:Patient's diagnosis: Respiratory failure, angioedema, pneumonia, alcoholism, delirium tremens, Patient's progosis: Unable to determine and Goals for treatment: Full Code  Additional follow-up to be provided: prn  Time spent during discussion:20 minutes  Bertrum Sol, MD

## 2018-06-23 NOTE — Clinical Social Work Placement (Signed)
   CLINICAL SOCIAL WORK PLACEMENT  NOTE  Date:  06/23/2018  Patient Details  Name: Jack Nicholson MRN: [id number] Date of Birth: August 19, 1961  Clinical Social Work is seeking post-discharge placement for this patient at the Skilled  Nursing Facility level of care (*CSW will initial, date and re-position this form in  chart as items are completed):  Yes   Patient/family provided with [place] Clinical Social Work Department's list of facilities offering this level of care within the geographic area requested by the patient (or if unable, by the patient's family).  Yes   Patient/family informed of their freedom to choose among providers that offer the needed level of care, that participate in Medicare, Medicaid or managed care program needed by the patient, have an available bed and are willing to accept the patient.  Yes   Patient/family informed of [facility]'s ownership interest in [facility] and [facility], as well as of the fact that they are under no obligation to receive care at these facilities.  PASRR submitted to EDS on 06/23/18     PASRR number received on 06/23/18     Existing PASRR number confirmed on       FL2 transmitted to all facilities in geographic area requested by pt/family on 06/23/18     FL2 transmitted to all facilities within larger geographic area on       Patient informed that his/her managed care company has contracts with or will negotiate with certain facilities, including the following:        Yes   Patient/family informed of bed offers received.  Patient chooses bed at [facility] )     Physician recommends and patient chooses bed at      Patient to be transferred to   on  .  Patient to be transferred to facility by       Patient family notified on   of transfer.  Name of family member notified:        PHYSICIAN       Additional Comment:    _______________________________________________ Curley Hogen, Darleen Crocker,  LCSW 06/23/2018, 4:13 PM

## 2018-06-23 NOTE — [place] FL2 (Addendum)
[place] MEDICAID FL2 LEVEL OF CARE SCREENING TOOL     IDENTIFICATION  Patient Name: Jack Nicholson Birthdate: [birth year] Sex: male Admission Date (Current Location): 06/17/2018  [place] and [place] Number:  Chiropodist and Address:  [facility], [address] [place], [place] [address]      Provider Number: [id number]  Attending Physician Name and Address:  Bud Face, MD  Relative Name and Phone Number:       Current Level of Care: Hospital Recommended Level of Care: Skilled Nursing Facility Prior Approval Number:    Date Approved/Denied:   PASRR Number:   [id number]  Discharge Plan: SNF    Current Diagnoses: Patient Active Problem List   Diagnosis Date Noted  . Protein-calorie malnutrition, severe 06/22/2018  . Angioedema 06/17/2018    Orientation RESPIRATION BLADDER Height & Weight     Self, Place, Time, Situation  Normal Incontinent Weight: 133 lb 9.6 oz (60.6 kg) Height:  5\' 11"  (180.3 cm)  BEHAVIORAL SYMPTOMS/MOOD NEUROLOGICAL BOWEL NUTRITION STATUS  (none) (none) Incontinent Diet(regular)  AMBULATORY STATUS COMMUNICATION OF NEEDS Skin   Extensive Assist Verbally Normal                       Personal Care Assistance Level of Assistance  Bathing, Feeding, Dressing Bathing Assistance: Limited assistance Feeding assistance: Limited assistance Dressing Assistance: Limited assistance     Functional Limitations Info  (no issues)          SPECIAL CARE FACTORS FREQUENCY  PT (By licensed PT)                    Contractures Contractures Info: Not present    Additional Factors Info  Code Status, Isolation Precautions Code Status Info: full       Isolation Precautions Info: mrsa     Current Medications (06/23/2018):  This is the current hospital active medication list Current Facility-Administered Medications  Medication Dose Route Frequency Provider Last Rate Last Dose  . 0.9 %  sodium  chloride infusion  250 mL Intravenous PRN Bud Face, MD   Stopped at 06/22/18 [phone]  . ceFEPIme (MAXIPIME) 1 g in sodium chloride 0.9 % 100 mL IVPB  1 g Intravenous Q8H Conforti, John, DO 200 mL/hr at 06/23/18 0536 1 g at 06/23/18 0536  . chlordiazePOXIDE (LIBRIUM) capsule 25 mg  25 mg Per Tube TID Uvaldo Rising, MD   25 mg at 06/23/18 0901  . cloNIDine (CATAPRES - Dosed in mg/24 hr) patch 0.1 mg  0.1 mg Transdermal Weekly Tukov-Yual, Magdalene S, NP   0.1 mg at 06/21/18 0109  . feeding supplement (ENSURE ENLIVE) (ENSURE ENLIVE) liquid 237 mL  237 mL Oral TID BM Conforti, John, DO   237 mL at 06/23/18 0901  . fentaNYL (SUBLIMAZE) injection 12.5 mcg  12.5 mcg Intravenous Q4H PRN Tukov-Yual, Magdalene S, NP   12.5 mcg at 06/20/18 2302  . folic acid (FOLVITE) tablet 1 mg  1 mg Oral Daily Conforti, John, DO   1 mg at 06/23/18 0901  . heparin injection 5,000 Units  5,000 Units Subcutaneous Q8H Vaught, Roney Mans, MD   5,000 Units at 06/23/18 0537  . hydrALAZINE (APRESOLINE) injection 10 mg  10 mg Intravenous Q4H PRN Tukov-Yual, Magdalene S, NP   10 mg at 06/21/18 1025  . LORazepam (ATIVAN) injection 1-2 mg  1-2 mg Intravenous Q1H PRN Samaan, Maged, MD      . multivitamin with minerals tablet 1 tablet  1 tablet Oral Daily Conforti, John, DO   1 tablet at 06/23/18 0901  . ondansetron (ZOFRAN) injection 4 mg  4 mg Intravenous Once PRN Yves Dill, MD      . sodium chloride 0.9 % bolus 1,000 mL  1,000 mL Intravenous Once Judithe Modest, NP 983.6 mL/hr at 06/17/18 2106    . thiamine (VITAMIN B-1) tablet 100 mg  100 mg Oral Daily Conforti, John, DO   100 mg at 06/23/18 0901     Discharge Medications: Please see discharge summary for a list of discharge medications.  Relevant Imaging Results:  Relevant Lab Results:   Additional Information ss: [id number]  York Spaniel, LCSW

## 2018-06-23 NOTE — H&P (Signed)
[facility] [place] [facility]   PATIENT NAME: Jack Nicholson    MR#:  [id number]  DATE OF BIRTH:  07-Sep-1961  DATE OF ADMISSION:  06/17/2018  PRIMARY CARE PHYSICIAN: Titus Mould, NP   REQUESTING/REFERRING PHYSICIAN:   CHIEF COMPLAINT:   Chief Complaint  Patient presents with  . Oral Swelling    HISTORY OF PRESENT ILLNESS: Jack Nicholson  is a 57 y.o. male with a known history per below presented to the emergency room on June 17, 2018 with acute respiratory failure due to ACE inhibitor induced angioedema, required emergent intubation by anesthesiology, ENT was consulted at that time, patient was admitted to the ICU and managed by intensivist for acute Enterobacter pneumonia thought to be due to aspiration, developed acute delirium tremens status post extubation, patient was transferred to regular nursing floor on June 22, 2018, hospitalist service was contacted today for medical management, patient in no apparent distress, resting comfortably in bed, wife present, patient continues to complain of severe generalized weakness, inability to ambulate/stand due to weakness, patient is now being managed for continued Enterobacter pneumonia, acute hypoxic respiratory failure which is resolved, delirium tremens which has resolved.  PAST MEDICAL HISTORY:   Past Medical History:  Diagnosis Date  . Alcohol abuse, daily use 01/2018   drinks several drinks of beer/hard liquor every day. has tremors and takes vitamin b12 for this  . Anemia    vitamin b12 deficiency  . Hypertension   . Tobacco abuse 01/2018   smokes at least 1 ppd. has heavy smokers' cough    PAST SURGICAL HISTORY:  Past Surgical History:  Procedure Laterality Date  . INTUBATION-ENDOTRACHEAL WITH TRACHEOSTOMY STANDBY N/A 06/17/2018   Procedure: INTUBATION;  Surgeon: Bud Face, MD;  Location: [facility] ORS;  Service: ENT;  Laterality: N/A;  . TONSILLECTOMY  1970  . WRIST SURGERY       SOCIAL HISTORY:  Social History   Tobacco Use  . Smoking status: Current Every Day Smoker    Packs/day: 1.00    Years: 40.00    Pack years: 40.00    Types: Cigarettes  . Smokeless tobacco: Never Used  Substance Use Topics  . Alcohol use: Yes    Alcohol/week: 4.0 - 8.0 standard drinks    Types: 2 - 4 Cans of beer, 2 - 4 Shots of liquor per week    Comment: drinks several drinks a night    FAMILY HISTORY: History reviewed. No pertinent family history.  DRUG ALLERGIES:  Allergies  Allergen Reactions  . Lisinopril Swelling    REVIEW OF SYSTEMS:   CONSTITUTIONAL: No fever, + fatigue, weakness.  EYES: No blurred or double vision.  EARS, NOSE, AND THROAT: No tinnitus or ear pain.  RESPIRATORY: + cough, shortness of breath, no wheezing or hemoptysis.  CARDIOVASCULAR: No chest pain, orthopnea, edema.  GASTROINTESTINAL: No nausea, vomiting, diarrhea or abdominal pain.  GENITOURINARY: No dysuria, hematuria.  ENDOCRINE: No polyuria, nocturia,  HEMATOLOGY: No anemia, easy bruising or bleeding SKIN: No rash or lesion. MUSCULOSKELETAL: No joint pain or arthritis.   NEUROLOGIC: No tingling, numbness, weakness.  PSYCHIATRY: No anxiety or depression.   MEDICATIONS AT HOME:  Prior to Admission medications   Medication Sig Start Date End Date Taking? Authorizing Provider  famotidine (PEPCID) 20 MG tablet Take 1 tablet (20 mg total) by mouth 2 (two) times daily. Patient not taking: Reported on 06/17/2018 02/13/18   Domenick Gong, MD  loratadine (CLARITIN) 10 MG tablet Take 1 tablet (10 mg total)  by mouth daily. Patient not taking: Reported on 06/17/2018 02/13/18   Domenick Gong, MD  predniSONE (STERAPRED UNI-PAK 21 TAB) 10 MG (21) TBPK tablet Dispense one 6 day pack. Take as directed with food. Patient not taking: Reported on 06/17/2018 02/13/18   Domenick Gong, MD      PHYSICAL EXAMINATION:   VITAL SIGNS: Blood pressure (!) 153/71, pulse 81, temperature 99.1 F (37.3 C),  temperature source Oral, resp. rate 18, height 5\' 11"  (1.803 m), weight 60.6 kg, SpO2 98 %.  GENERAL:  57 y.o.-year-old patient lying in the bed with no acute distress.  Frail-appearing EYES: Pupils equal, round, reactive to light and accommodation. No scleral icterus. Extraocular muscles intact.  HEENT: Head atraumatic, normocephalic. Oropharynx and nasopharynx clear.  NECK:  Supple, no jugular venous distention. No thyroid enlargement, no tenderness.  LUNGS: Bilateral rhonchi/wheezing. No use of accessory muscles of respiration.  CARDIOVASCULAR: S1, S2 normal. No murmurs, rubs, or gallops.  ABDOMEN: Soft, nontender, nondistended. Bowel sounds present. No organomegaly or mass.  EXTREMITIES: No pedal edema, cyanosis, or clubbing.  NEUROLOGIC: Cranial nerves II through XII are intact. MAES. Gait not checked.  PSYCHIATRIC: The patient is alert and oriented x 3.  SKIN: No obvious rash, lesion, or ulcer.   LABORATORY PANEL:   CBC Recent Labs  Lab 06/17/18 1946 06/18/18 0306 06/19/18 0609 06/20/18 0609 06/21/18 0530 06/22/18 1012  WBC 11.8* 6.8 14.9* 8.4 12.5* 13.1*  HGB 14.8 13.6 12.4* 12.1* 14.3 14.3  HCT 42.9 39.6* 36.0* 34.9* 41.9 41.1  PLT 212 212 203 184 210 221  MCV 98.8 100.7* 101.4* 102.3* 100.3* 99.5  MCH 34.0 34.6* 35.0* 35.5* 34.3* 34.7*  MCHC 34.4 34.4 34.5 34.7 34.2 34.9  RDW 14.4 14.4 14.5 14.8* 14.7* 14.5  LYMPHSABS 0.3*  --  0.4* 0.5* 1.5 1.2  MONOABS 0.1*  --  0.7 0.4 1.2* 1.2*  EOSABS 0.0  --  0.0 0.0 0.1 0.1  BASOSABS 0.0  --  0.1 0.0 0.0 0.0   ------------------------------------------------------------------------------------------------------------------  Chemistries  Recent Labs  Lab 06/17/18 1946  06/20/18 0609 06/21/18 0530 06/21/18 1522 06/21/18 2322 06/22/18 1012 06/23/18 0426  NA 125*   < > 137 140  --  136 134* 139  K 3.6   < > 3.8 2.8* 2.7* 2.7* 3.4* 3.3*  CL 91*   < > 103 100  --  96* 97* 101  CO2 21*   < > 27 27  --  27 25 30   GLUCOSE  155*   < > 125* 85  --  103* 103* 109*  BUN 25*   < > 22* 18  --  13 12 15   CREATININE 1.28*   < > 0.93 0.80  --  0.84 0.75 0.89  CALCIUM 8.1*   < > 7.8* 8.5*  --  8.0* 8.2* 8.6*  MG 1.2*   < > 2.2 1.7  --  1.5* 2.3 2.1  AST 32  --   --   --   --  27  --   --   ALT 16  --   --   --   --  15  --   --   ALKPHOS 54  --   --   --   --  51  --   --   BILITOT 1.2  --   --   --   --  1.1  --   --    < > = values in this interval not displayed.   ------------------------------------------------------------------------------------------------------------------  estimated creatinine clearance is 79.4 mL/min (by C-G formula based on SCr of 0.89 mg/dL). ------------------------------------------------------------------------------------------------------------------ No results for input(s): TSH, T4TOTAL, T3FREE, THYROIDAB in the last 72 hours.  Invalid input(s): FREET3   Coagulation profile Recent Labs  Lab 06/17/18 1946  INR 1.07   ------------------------------------------------------------------------------------------------------------------- No results for input(s): DDIMER in the last 72 hours. -------------------------------------------------------------------------------------------------------------------  Cardiac Enzymes No results for input(s): CKMB, TROPONINI, MYOGLOBIN in the last 168 hours.  Invalid input(s): CK ------------------------------------------------------------------------------------------------------------------ Invalid input(s): POCBNP  ---------------------------------------------------------------------------------------------------------------  Urinalysis No results found for: COLORURINE, APPEARANCEUR, LABSPEC, PHURINE, GLUCOSEU, HGBUR, BILIRUBINUR, KETONESUR, PROTEINUR, UROBILINOGEN, NITRITE, LEUKOCYTESUR   RADIOLOGY: Dg Chest Port 1 View  Result Date: 06/22/2018 CLINICAL DATA:  Pneumonia. EXAM: PORTABLE CHEST 1 VIEW COMPARISON:  06/21/2018 FINDINGS:  Cardiomediastinal silhouette is normal. Mediastinal contours appear intact. There is no evidence of pneumothorax. Mild right pleural thickening. Persistent bilateral patchy airspace consolidation. Osseous structures are without acute abnormality. Soft tissues are grossly normal. IMPRESSION: Persistent bilateral patchy airspace consolidation. Mild right pleural thickening versus small pleural effusion. Electronically Signed   By: Ted Mcalpine M.D.   On: 06/22/2018 08:22    EKG: Orders placed or performed during the hospital encounter of 01/30/18  . EKG 12-Lead  . EKG 12-Lead    IMPRESSION AND PLAN: *Acute Enterobacter pneumonia Most likely secondary to aspiration Continue pneumonia protocol, cefepime for 5-7-day course  *Acute delirium tremens Resolved Secondary to chronic alcohol abuse Continue alcohol withdrawal protocol  *Acute hypoxic respiratory failure Resolved Secondary to ACE inhibitor induced angioedema  *Acute angioedema Secondary to ACE inhibitor use ACE inhibitor now listed as allergy  *Chronic tobacco smoking abuse/dependency Nicotine patch and cessation counseling ordered  *Acute hypokalemia Replete with p.o. potassium, check magnesium level, BMP in the morning  *Chronic alcohol abuse/alcoholism Cessation counseling given while in house Will need referral to Alcoholics Anonymous status post discharge  All the records are reviewed and case discussed with ED provider. Management plans discussed with the patient, family and they are in agreement.  CODE STATUS:full    Code Status Orders  (From admission, onward)         Start     Ordered   06/17/18 1913  Full code  Continuous     06/17/18 1912        Code Status History    This patient has a current code status but no historical code status.       TOTAL TIME TAKING CARE OF THIS PATIENT: 45 minutes.    Evelena Asa Wanisha Shiroma M.D on 06/23/2018   Between 7am to 6pm - Pager - [phone]  After  6pm go to [email or web address] - Social research officer, government  [facility]  Office  [phone]  CC: Primary care physician; White, Arlyss Repress, NP   Note: This dictation was prepared with Dragon dictation along with smaller phrase technology. Any transcriptional errors that result from this process are unintentional.

## 2018-06-23 NOTE — Evaluation (Signed)
Physical Therapy Evaluation Patient Details Name: Jack Nicholson MRN: [id number] DOB: [birth year] Today's Date: 06/23/2018   History of Present Illness  57 y.o. Male with a PMH of ETOH abuse (daily use), anemia, HTN, and Tobacco abuse who presented to [facility] ED on 06/17/18 with c/o acute onset of Lip and facial swelling.  He reported that it began around 10 am on 9/4 with his lower lip, then began to spread to his upper lip, cheek, and neck.  Pt required emergent intubation and was extubated 9/7.    Clinical Impression  Pt eager to work with PT and showed desire to show he can go home and get back to work however he struggled significantly with ambulation and generally was very weak, along with coordination and quality of motion issues likely associated with DTs/ETOH withdrawal.  He was able to do in bed mobility and generally displayed good confidence with that but standing and ambulation were unsafe and far from baseline (normally no need for AD, working, etc).  Pt acknowledges that he is unsafe to go home at this point and is open to STR if he does not make significant gains but is hoping to be able to go home.  At this point he needs a walker and assist with even very limited ambulation is room and displayed poor balance and tolerance with minimal in-room activity.      Follow Up Recommendations SNF(hoping to improve enough to go home with HHPT)    Equipment Recommendations       Recommendations for Other Services       Precautions / Restrictions Precautions Precautions: Fall Restrictions Weight Bearing Restrictions: No      Mobility  Bed Mobility Overal bed mobility: Modified Independent             General bed mobility comments: Pt able to get to sitting at EOB w/o direct assist  Transfers Overall transfer level: Modified independent Equipment used: Rolling walker (2 wheeled)             General transfer comment: Pt needing explaination for walker use, set up (U&LEs),  sequencing but physically able to rise w/o direct assist  Ambulation/Gait Ambulation/Gait assistance: Min assist Gait Distance (Feet): 20 Feet Assistive device: Rolling walker (2 wheeled)       General Gait Details: Pt showed very poor cadence (short, shuffling), awareness with walker and generally needed constant cuing and reminders to use walker appropriate, maintain upright posture, lengthen steps, etc  Stairs            Wheelchair Mobility    Modified Rankin (Stroke Patients Only)       Balance Overall balance assessment: Needs assistance   Sitting balance-Leahy Scale: Normal       Standing balance-Leahy Scale: Poor Standing balance comment: Pt leaning heavily on walker, poor body awareness with BOS (often outside of walker).  Pt highly reliant on UEs to maintain balance but poor awareness with using it                             Pertinent Vitals/Pain Pain Assessment: No/denies pain    Home Living Family/patient expects to be discharged to:: Private residence Living Arrangements: Spouse/significant other Available Help at Discharge: Family(wife avail 24/7 for the next week)   Home Access: Stairs to enter Entrance Stairs-Rails: Can reach both(b/l rails at 3 steps into home) Entrance Stairs-Number of Steps: 1 + 3 Home Layout: One level Home Equipment:  Grab bars - toilet(has access to RW)      Prior Function Level of Independence: Independent         Comments: Pt was working as Chartered certified accountant, 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, sports        Extremity/Trunk Assessment   Upper Extremity Assessment Upper Extremity Assessment: Generalized weakness(Pt surprisingly weak given PLOF, grossly 3-/5)    Lower Extremity Assessment Lower Extremity Assessment: Generalized weakness(again weaker than PLOF and age would indicate, grossly 3-/5)       Communication   Communication: No difficulties  Cognition Arousal/Alertness: Awake/alert Behavior During  Therapy: WFL for tasks assessed/performed Overall Cognitive Status: Within Functional Limits for tasks assessed                                        General Comments      Exercises     Assessment/Plan    PT Assessment Patient needs continued PT services  PT Problem List Decreased strength;Decreased range of motion;Decreased activity tolerance;Decreased balance;Decreased mobility;Decreased coordination;Decreased knowledge of use of DME;Decreased safety awareness       PT Treatment Interventions DME instruction;Gait training;Stair training;Functional mobility training;Therapeutic activities;Therapeutic exercise;Balance training;Neuromuscular re-education;Patient/family education    PT Goals (Current goals can be found in the Care Plan section)  Acute Rehab PT Goals Patient Stated Goal: get back to work PT Goal Formulation: With patient Time For Goal Achievement: 07/07/18 Potential to Achieve Goals: Good    Frequency Min 2X/week   Barriers to discharge        Co-evaluation               AM-PAC PT "6 Clicks" Daily Activity  Outcome Measure Difficulty turning over in bed (including adjusting bedclothes, sheets and blankets)?: None Difficulty moving from lying on back to sitting on the side of the bed? : None Difficulty sitting down on and standing up from a chair with arms (e.g., wheelchair, bedside commode, etc,.)?: A Little Help needed moving to and from a bed to chair (including a wheelchair)?: A Lot Help needed walking in hospital room?: A Lot Help needed climbing 3-5 steps with a railing? : Total 6 Click Score: 16    End of Session Equipment Utilized During Treatment: Gait belt Activity Tolerance: Patient tolerated treatment well(poor awareness/safety with ambulation/standing) Patient left: with chair alarm set;with call bell/phone within reach Nurse Communication: Mobility status PT Visit Diagnosis: Muscle weakness (generalized)  (M62.81);Difficulty in walking, not elsewhere classified (R26.2);Unsteadiness on feet (R26.81)    Time: 8182-9937 PT Time Calculation (min) (ACUTE ONLY): 33 min   Charges:   PT Evaluation $PT Eval Low Complexity: 1 Low          Malachi Pro, DPT 06/23/2018, 10:58 AM

## 2018-06-23 NOTE — Progress Notes (Signed)
Pharmacy Electrolyte Monitoring Consult:  Pharmacy consulted to assist in monitoring and replacing electrolytes in this 57 y.o. male admitted on 06/17/2018 with angioedema  Labs:  Sodium (mmol/L)  Date Value  06/23/2018 139   Potassium (mmol/L)  Date Value  06/23/2018 3.3 (L)   Magnesium (mg/dL)  Date Value  62/13/0865 2.0   Phosphorus (mg/dL)  Date Value  78/46/9629 3.2   Calcium (mg/dL)  Date Value  52/84/1324 8.6 (L)   Albumin (g/dL)  Date Value  40/07/2724 2.9 (L)    Assessment/Plan: K=3.3, Will give potassium 40 mEq PO x 1 as patient can now tolerate oral meds. Will recheck electrolytes with morning labs.  Pharmacy to continue to follow patient and replace electrolytes per consult.  Clovia Cuff, PharmD, BCPS 06/23/2018 3:48 PM

## 2018-06-24 LAB — BASIC METABOLIC PANEL
Anion gap: 9 (ref 5–15)
BUN: 14 mg/dL (ref 6–20)
CALCIUM: 9.3 mg/dL (ref 8.9–10.3)
CO2: 27 mmol/L (ref 22–32)
Chloride: 104 mmol/L (ref 98–111)
Creatinine, Ser: 0.82 mg/dL (ref 0.61–1.24)
GFR calc Af Amer: >60 mL/min (ref >60)
GLUCOSE: 144 mg/dL — AB (ref 70–99)
Potassium: 3.5 mmol/L (ref 3.5–5.1)
Sodium: 140 mmol/L (ref 135–145)

## 2018-06-24 LAB — CULTURE, BLOOD (ROUTINE X 2)
Culture: NO GROWTH
Culture: NO GROWTH
SPECIAL REQUESTS: ADEQUATE
Special Requests: ADEQUATE

## 2018-06-24 MED ORDER — CHLORDIAZEPOXIDE HCL 25 MG PO CAPS
25.0000 mg | ORAL_CAPSULE | Freq: Two times a day (BID) | ORAL | Status: DC
  Administered 2018-06-24 – 2018-06-25 (×2): 25 mg
  Filled 2018-06-24 (×2): qty 1

## 2018-06-24 NOTE — Progress Notes (Signed)
Physical Therapy Treatment Patient Details Name: Jack Nicholson MRN: [id number] DOB: [birth year] Today's Date: 06/24/2018    History of Present Illness 57 y.o. Male with a PMH of ETOH abuse (daily use), anemia, HTN, and Tobacco abuse who presented to [facility] ED on 06/17/18 with c/o acute onset of Lip and facial swelling.  He reported that it began around 10 am on 9/4 with his lower lip, then began to spread to his upper lip, cheek, and neck.  Pt required emergent intubation and was extubated 9/7.      PT Comments    Pt agreeable to PT; denies pain. Pt progressing overall; however, continues to demonstrate limited ambulation with weak/mildly unsteady pattern requiring Min A. No overt LOB. Poor stand posture (able to correct upon command for brief periods only) demonstrating significant weakness (no pain appreciated). Pt performs and educated with spouse in BLE exercises; encouraged performance of either STS at chair only with assist and seated/long sit exercises every 2 hours to progress endurance and strength; also encouraged staying up in chair throughout day. Pt/spouses questions answered regarding discharge recommendations. 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   Follow Up Recommendations  SNF     Equipment Recommendations  Rolling walker with 5" wheels    Recommendations for Other Services       Precautions / Restrictions Precautions Precautions: Fall Restrictions Weight Bearing Restrictions: No    Mobility  Bed Mobility Overal bed mobility: Modified Independent                Transfers Overall transfer level: Needs assistance Equipment used: Rolling walker (2 wheeled) Transfers: Sit to/from Stand Sit to Stand: Min guard         General transfer comment: Cues for proper hand placement for stand and sit x3 transfers.  Mildly unsteady with poor stand posture. No LOB  Ambulation/Gait Ambulation/Gait assistance: Min assist Gait Distance (Feet): 35 Feet Assistive device: Rolling walker (2 wheeled) Gait Pattern/deviations: Step-through pattern;Decreased stride length;Trunk flexed Gait velocity: decreased   General Gait Details: Mildly unsteady without overt LOB; BLEs weak and mildly shakey. Poor endurance overall, but improving.    Stairs             Wheelchair Mobility    Modified Rankin (Stroke Patients Only)       Balance Overall balance assessment: Needs assistance   Sitting balance-Leahy Scale: Good     Standing balance support: Bilateral upper extremity supported Standing balance-Leahy Scale: Fair Standing balance comment: Poor stand posture demonstrating weak glutes/back extensors. Able to demonstrate improved stand posutre on command for brief periods                            Cognition Arousal/Alertness: Awake/alert Behavior During Therapy: WFL for tasks assessed/performed Overall Cognitive Status: Within Functional Limits for tasks assessed                                        Exercises General Exercises - Lower Extremity Ankle Circles/Pumps: AROM;Both;15 reps Quad Sets: Strengthening;Both;10 reps Gluteal Sets: Strengthening;Both;10 reps Long Arc Quad: AROM;Both;10 reps;Seated Heel Slides: AROM;Both;10 reps;Seated Hip ABduction/ADduction: Strengthening;Both;10 reps;Standing(educated on seated version ) Straight Leg Raises: Strengthening;Both;10 reps;Standing(educated on supine/seated version) Hip Flexion/Marching: Strengthening;Both;10 reps;Standing(educated  on seated version) Other Exercises Other Exercises: stand hip ext B 10 repetitions  Other Exercises: Pt/spouse questions answered regarding discharge recommendations    General Comments        Pertinent Vitals/Pain Pain Assessment: No/denies pain    Home Living                       Prior Function            PT Goals (current goals can now be found in the care plan section) Progress towards PT goals: Progressing toward goals    Frequency    Min 2X/week      PT Plan Current plan remains appropriate    Co-evaluation              AM-PAC PT "6 Clicks" Daily Activity  Outcome Measure  Difficulty turning over in bed (including adjusting bedclothes, sheets and blankets)?: None Difficulty moving from lying on back to sitting on the side of the bed? : None Difficulty sitting down on and standing up from a chair with arms (e.g., wheelchair, bedside commode, etc,.)?: Unable Help needed moving to and from a bed to chair (including a wheelchair)?: A Little Help needed walking in hospital room?: A Little Help needed climbing 3-5 steps with a railing? : A Lot 6 Click Score: 17    End of Session Equipment Utilized During Treatment: Gait belt Activity Tolerance: Patient tolerated treatment well Patient left: in chair;with call bell/phone within reach;with family/visitor present;Other (comment)(declined alarm to perform ex; will call to ambulate) Nurse Communication: Mobility status PT Visit Diagnosis: Muscle weakness (generalized) (M62.81);Difficulty in walking, not elsewhere classified (R26.2);Unsteadiness on feet (R26.81)     Time: 1610-9604 PT Time Calculation (min) (ACUTE ONLY): 30 min  Charges:  $Gait Training: 8-22 mins $Therapeutic Exercise: 8-22 mins                      Scot Dock, PTA 06/24/2018, 12:00 PM

## 2018-06-24 NOTE — Progress Notes (Signed)
Pharmacy Electrolyte Monitoring Consult:  Pharmacy consulted to assist in monitoring and replacing electrolytes in this 57 y.o. male admitted on 06/17/2018 with angioedema  Labs:  Sodium (mmol/L)  Date Value  06/24/2018 140   Potassium (mmol/L)  Date Value  06/24/2018 3.5   Magnesium (mg/dL)  Date Value  54/62/7035 2.0   Phosphorus (mg/dL)  Date Value  00/93/8182 3.2   Calcium (mg/dL)  Date Value  99/37/1696 9.3   Albumin (g/dL)  Date Value  78/93/8101 2.9 (L)    Assessment/Plan: K=3.5 Patient ordered KCL PO 20 meq daily. No further supplementation at this time Will recheck electrolytes with morning labs.  Pharmacy to continue to follow patient and replace electrolytes per consult.  Bari Mantis PharmD Clinical Pharmacist 06/24/2018

## 2018-06-24 NOTE — Progress Notes (Signed)
Pt up to bathroom with 1 assist with walker. Did well but was weak and shaky.

## 2018-06-24 NOTE — Progress Notes (Signed)
Clinical Social Worker (CSW) met with patient's wife [name] and made her aware of co-payments for SNF. Per wife she would like for patient to D/C to [facility]. Per MD patient will likely be ready for D/C tomorrow. [facility] admissions coordinator at [facility] is aware of above.   [employer], LCSW [phone]

## 2018-06-24 NOTE — Progress Notes (Addendum)
[employer] - [place] at [facility]   PATIENT NAME: Jack Nicholson    MR#:  [id number]  DATE OF BIRTH:  1961/03/09  SUBJECTIVE:   Pt. Admitted hospital due to shortness of breath secondary to angioedema and was initially urgently intubated.  Angioedema has improved and patient is not extubated.  Transferred to the floor yesterday.    Still quite weak and debilitated. Wife at bedside.   REVIEW OF SYSTEMS:    Review of Systems  Constitutional: Negative for chills and fever.  HENT: Negative for congestion and tinnitus.   Eyes: Negative for blurred vision and double vision.  Respiratory: Negative for cough, shortness of breath and wheezing.   Cardiovascular: Negative for chest pain, orthopnea and PND.  Gastrointestinal: Negative for abdominal pain, diarrhea, nausea and vomiting.  Genitourinary: Negative for dysuria and hematuria.  Neurological: Positive for weakness (Generalized Weakness). Negative for dizziness, sensory change and focal weakness.  All other systems reviewed and are negative.   Nutrition: Regular Tolerating Diet: Yes Tolerating PT: Await Eval.    DRUG ALLERGIES:   Allergies  Allergen Reactions  . Lisinopril Swelling    VITALS:  Blood pressure 134/82, pulse 75, temperature 98.7 F (37.1 C), temperature source Oral, resp. rate 18, height 5\' 11"  (1.803 m), weight 60.6 kg, SpO2 100 %.  PHYSICAL EXAMINATION:   Physical Exam  GENERAL:  57 y.o.-year-old patient lying in bed in no acute distress.  EYES: Pupils equal, round, reactive to light and accommodation. No scleral icterus. Extraocular muscles intact.  HEENT: Head atraumatic, normocephalic. Oropharynx and nasopharynx clear.  NECK:  Supple, no jugular venous distention. No thyroid enlargement, no tenderness.  LUNGS: Normal breath sounds bilaterally, no wheezing, rales, rhonchi. No use of accessory muscles of respiration.  CARDIOVASCULAR: S1, S2 normal. No murmurs, rubs, or gallops.   ABDOMEN: Soft, nontender, nondistended. Bowel sounds present. No organomegaly or mass.  EXTREMITIES: No cyanosis, clubbing or edema b/l.   Globally weak NEUROLOGIC: Cranial nerves II through XII are intact. No focal Motor or sensory deficits b/l.   PSYCHIATRIC: The patient is alert and oriented x 3.  SKIN: No obvious rash, lesion, or ulcer.    LABORATORY PANEL:   CBC Recent Labs  Lab 06/22/18 1012  WBC 13.1*  HGB 14.3  HCT 41.1  PLT 221   ------------------------------------------------------------------------------------------------------------------  Chemistries  Recent Labs  Lab 06/21/18 2322  06/23/18 1324 06/24/18 0435  NA 136   < >  --  140  K 2.7*   < >  --  3.5  CL 96*   < >  --  104  CO2 27   < >  --  27  GLUCOSE 103*   < >  --  144*  BUN 13   < >  --  14  CREATININE 0.84   < >  --  0.82  CALCIUM 8.0*   < >  --  9.3  MG 1.5*   < > 2.0  --   AST 27  --   --   --   ALT 15  --   --   --   ALKPHOS 51  --   --   --   BILITOT 1.1  --   --   --    < > = values in this interval not displayed.   ------------------------------------------------------------------------------------------------------------------  Cardiac Enzymes No results for input(s): TROPONINI in the last 168 hours. ------------------------------------------------------------------------------------------------------------------  RADIOLOGY:  No results found.   ASSESSMENT AND PLAN:  57 year old male with past medical history of alcohol abuse, tobacco abuse, essential hypertension who presented to the hospital due to shortness of breath and was noted to have angioedema secondary to ACE inhibitors.  1.  Angioedema-patient usually presented to the hospital secondary to severe angioedema secondary to ACE inhibitors.  He was taken to the OR and was intubated due to concern for airway compromise. - Angioedema has significantly improved now.  Patient is extubated and hemodynamically stable.  2.   Acute respiratory failure-secondary to angioedema and also Enterobacter pneumonia. - Patient now extubated and doing well from respiratory standpoint.  Continue IV cefepime and will switch to oral Augmentin upon discharge.  3.  Acute alcohol withdrawal/delirium tremens- continue Librium taper.  4.  Tobacco abuse-continue nicotine patch.  5.  Essential hypertension-continue clonidine patch, metoprolol.  PT eval noted and and they recommend SNF/STR.   All the records are reviewed and case discussed with Care Management/Social Worker. Management plans discussed with the patient, family and they are in agreement.  CODE STATUS: Full code  DVT Prophylaxis: Hep SQ  TOTAL TIME TAKING CARE OF THIS PATIENT: 30 minutes.   POSSIBLE D/C IN 1-2 DAYS, DEPENDING ON CLINICAL CONDITION.   Houston Siren M.D on 06/24/2018 at 1:38 PM  Between 7am to 6pm - Pager - [phone]  After 6pm go to [email or web address] - Scientist, research (life sciences) [facility]  Office  [phone]  CC: Primary care physician; Titus Mould, NP

## 2018-06-25 LAB — BASIC METABOLIC PANEL
Anion gap: 11 (ref 5–15)
BUN: 14 mg/dL (ref 6–20)
CALCIUM: 8.9 mg/dL (ref 8.9–10.3)
CO2: 25 mmol/L (ref 22–32)
Chloride: 102 mmol/L (ref 98–111)
Creatinine, Ser: 0.72 mg/dL (ref 0.61–1.24)
GFR calc Af Amer: >60 mL/min (ref >60)
GFR calc non Af Amer: >60 mL/min (ref >60)
GLUCOSE: 98 mg/dL (ref 70–99)
Potassium: 3.7 mmol/L (ref 3.5–5.1)
Sodium: 138 mmol/L (ref 135–145)

## 2018-06-25 LAB — PHOSPHORUS: Phosphorus: 5 mg/dL — ABNORMAL HIGH (ref 2.5–4.6)

## 2018-06-25 LAB — MAGNESIUM: Magnesium: 1.7 mg/dL (ref 1.7–2.4)

## 2018-06-25 MED ORDER — IPRATROPIUM-ALBUTEROL 0.5-2.5 (3) MG/3ML IN SOLN: 3.0000 mL | Freq: Four times a day (QID) | RESPIRATORY_TRACT | Status: AC | PRN

## 2018-06-25 MED ORDER — CHLORDIAZEPOXIDE HCL 25 MG PO CAPS: ORAL_CAPSULE | ORAL | 0 refills | Status: AC

## 2018-06-25 MED ORDER — METOPROLOL TARTRATE 25 MG PO TABS: 25.0000 mg | ORAL_TABLET | Freq: Two times a day (BID) | ORAL | Status: AC

## 2018-06-25 MED ORDER — CLONIDINE 0.1 MG/24HR TD PTWK: 0.1000 mg | MEDICATED_PATCH | TRANSDERMAL | 12 refills | Status: AC

## 2018-06-25 MED ORDER — BUDESONIDE 0.5 MG/2ML IN SUSP: 0.5000 mg | Freq: Two times a day (BID) | RESPIRATORY_TRACT | 12 refills | Status: AC | PRN

## 2018-06-25 NOTE — Clinical Social Work Placement (Signed)
   CLINICAL SOCIAL WORK PLACEMENT  NOTE  Date:  06/25/2018  Patient Details  Name: Jack Nicholson MRN: [id number] Date of Birth: 09/10/1961  Clinical Social Work is seeking post-discharge placement for this patient at the Skilled  Nursing Facility level of care (*CSW will initial, date and re-position this form in  chart as items are completed):  Yes   Patient/family provided with [facility] Clinical Social Work Department's list of facilities offering this level of care within the geographic area requested by the patient (or if unable, by the patient's family).  Yes   Patient/family informed of their freedom to choose among providers that offer the needed level of care, that participate in Medicare, Medicaid or managed care program needed by the patient, have an available bed and are willing to accept the patient.  Yes   Patient/family informed of 's ownership interest in [facility] and [facility], as well as of the fact that they are under no obligation to receive care at these facilities.  PASRR submitted to EDS on 06/23/18     PASRR number received on 06/23/18     Existing PASRR number confirmed on       FL2 transmitted to all facilities in geographic area requested by pt/family on 06/23/18     FL2 transmitted to all facilities within larger geographic area on       Patient informed that his/her managed care company has contracts with or will negotiate with certain facilities, including the following:        Yes   Patient/family informed of bed offers received.  Patient chooses bed at [facility] )     Physician recommends and patient chooses bed at      Patient to be transferred to [facility] ) on 06/25/18.  Patient [facility]     Patient family notified on 06/25/18 of transfer.  Name of family member notified:  (Patient's wife Jack Nicholson is aware of D/C today. )     PHYSICIAN        Additional Comment:    _______________________________________________ Alantra Popoca, Darleen CrockerBailey M, LCSW 06/25/2018, 12:15 PM

## 2018-06-25 NOTE — Progress Notes (Signed)
Patient is medically stable for D/C to [facility] today. [facility] authorization has been received and patient can come today. RN will call report and arrange EMS for transport. Clinical Child psychotherapistocial Worker (CSW) sent D/C orders to [facility] via [facility]. Patient is aware of above. Patient's wife Marylu LundJanet is at bedside and aware of above. Please reconsult if future social work needs arise. CSW signing off.   Baker Hughes IncorporatedBailey Kyllian Clingerman, LCSW [phone]

## 2018-06-25 NOTE — Discharge Summary (Signed)
[facility] [place] [facility]   PATIENT NAME: Jack Nicholson    MR#:  [id number]  DATE OF BIRTH:  02-12-1961  DATE OF ADMISSION:  06/17/2018 ADMITTING PHYSICIAN: Bud Face, MD  DATE OF DISCHARGE: 06/25/2018  PRIMARY CARE PHYSICIAN: Titus Mould, NP    ADMISSION DIAGNOSIS:  Angioedema, initial encounter [T78.3XXA]  DISCHARGE DIAGNOSIS:  Active Problems:   Angioedema   Protein-calorie malnutrition, severe   SECONDARY DIAGNOSIS:   Past Medical History:  Diagnosis Date  . Alcohol abuse, daily use 01/2018   drinks several drinks of beer/hard liquor every day. has tremors and takes vitamin b12 for this  . Anemia    vitamin b12 deficiency  . Hypertension   . Tobacco abuse 01/2018   smokes at least 1 ppd. has heavy smokers' cough    HOSPITAL COURSE:   57 year old male with past medical history of alcohol abuse, tobacco abuse, essential hypertension who presented to the hospital due to shortness of breath and was noted to have angioedema secondary to ACE inhibitors.  1.  Angioedema- presented to the hospital secondary to significant tongue swelling and angioedema.  There was a significant concern for airway compromise and therefore he was seen by ENT and taken urgently to the OR and was intubated.  Patient was treated for the angioedema with steroids, Pepcid, Benadryl.  Patient has significantly improved, his angioedema has resolved.  He is no longer going to be taking an ACE inhibitor.  2.  Acute respiratory failure-secondary to angioedema and also Enterobacter pneumonia. -Patient was intubated secondary to angioedema but also developed pneumonia.  Patient's tracheal aspirate was positive for Enterobacter cloacae.  he has been treated adequately with 8 days of IV antibiotics and this has now resolved. -Patient is extubated and doing well from the respiratory standpoint.  He is afebrile and hemodynamically stable.  3.  Enterobacter  pneumonia-as mentioned above patient was treated with IV antibiotics with cefepime and this has resolved now.  Patient is presently afebrile hemodynamically stable.  4.  Acute alcohol withdrawal/delirium tremens-  patient has a long history of alcohol abuse and therefore underwent symptoms of alcohol withdrawal.  Patient was treated with CIWA protocol and now is on a Librium taper which he will finish.  5.  Essential hypertension- patient has been taken off ACE inhibitors due to his angioedema.  He is now being discharged on clonidine patch and metoprolol.  Blood pressures currently stable.  6.  COPD with tobacco abuse- patient will continue duo nebs as needed along with Pulmicort nebs as needed.  He was in a nicotine patch while in the hospital and has been strongly advised to abstain from smoking.  7.  Generalized weakness- patient has developed significant deconditioning and generalized weakness post extubation and therefore was seen by physical therapy and the recommend short-term rehab.  Patient is now being discharged to short-term rehab for ongoing physical therapy.  DISCHARGE CONDITIONS:   Stable  CONSULTS OBTAINED:  Treatment Team:  Houston Siren, MD  DRUG ALLERGIES:   Allergies  Allergen Reactions  . Lisinopril Swelling    DISCHARGE MEDICATIONS:   Allergies as of 06/25/2018      Reactions   Lisinopril Swelling      Medication List    STOP taking these medications   famotidine 20 MG tablet Commonly known as:  PEPCID   loratadine 10 MG tablet Commonly known as:  CLARITIN   predniSONE 10 MG (21) Tbpk tablet Commonly known as:  STERAPRED UNI-PAK 21  TAB     TAKE these medications   budesonide 0.5 MG/2ML nebulizer solution Commonly known as:  PULMICORT Take 2 mLs (0.5 mg total) by nebulization 2 (two) times daily as needed.   chlordiazePOXIDE 25 MG capsule Commonly known as:  LIBRIUM 25 mg PO BID X 2 days, then 25 mg PO Daily X 2 days then discontinue.    cloNIDine 0.1 mg/24hr patch Commonly known as:  CATAPRES - Dosed in mg/24 hr Place 1 patch (0.1 mg total) onto the skin once a week. Start taking on:  06/28/2018   ipratropium-albuterol 0.5-2.5 (3) MG/3ML Soln Commonly known as:  DUONEB Take 3 mLs by nebulization every 6 (six) hours as needed.   metoprolol tartrate 25 MG tablet Commonly known as:  LOPRESSOR Take 1 tablet (25 mg total) by mouth 2 (two) times daily.         DISCHARGE INSTRUCTIONS:   DIET:  Cardiac diet  DISCHARGE CONDITION:  Stable  ACTIVITY:  Activity as tolerated  OXYGEN:  Home Oxygen: No.   Oxygen Delivery: room air  DISCHARGE LOCATION:  nursing home   If you experience worsening of your admission symptoms, develop shortness of breath, life threatening emergency, suicidal or homicidal thoughts you must seek medical attention immediately by calling 911 or calling your MD immediately  if symptoms less severe.  You Must read complete instructions/literature along with all the possible adverse reactions/side effects for all the Medicines you take and that have been prescribed to you. Take any new Medicines after you have completely understood and accpet all the possible adverse reactions/side effects.   Please note  You were cared for by a hospitalist during your hospital stay. If you have any questions about your discharge medications or the care you received while you were in the hospital after you are discharged, you can call the unit and asked to speak with the hospitalist on call if the hospitalist that took care of you is not available. Once you are discharged, your primary care physician will handle any further medical issues. Please note that NO REFILLS for any discharge medications will be authorized once you are discharged, as it is imperative that you return to your primary care physician (or establish a relationship with a primary care physician if you do not have one) for your aftercare needs so  that they can reassess your need for medications and monitor your lab values.     Today   No acute events overnight, still feels quite weak and debilitated.  Physical therapy still recommending short-term rehab and patient's wife is in agreement.  Will discharge to short-term rehab today.  VITAL SIGNS:  Blood pressure (!) 142/84, pulse 84, temperature 98.6 F (37 C), temperature source Oral, resp. rate 18, height 5\' 11"  (1.803 m), weight 60.6 kg, SpO2 98 %.  I/O:    Intake/Output Summary (Last 24 hours) at 06/25/2018 1055 Last data filed at 06/25/2018 0645 Gross per 24 hour  Intake 240 ml  Output -  Net 240 ml    PHYSICAL EXAMINATION:  GENERAL:  57 y.o.-year-old patient lying in the bed with no acute distress.  EYES: Pupils equal, round, reactive to light and accommodation. No scleral icterus. Extraocular muscles intact.  HEENT: Head atraumatic, normocephalic. Oropharynx and nasopharynx clear.  NECK:  Supple, no jugular venous distention. No thyroid enlargement, no tenderness.  LUNGS: Normal breath sounds bilaterally, no wheezing, rales,rhonchi. No use of accessory muscles of respiration.  CARDIOVASCULAR: S1, S2 normal. No murmurs, rubs, or gallops.  ABDOMEN: Soft, non-tender, non-distended. Bowel sounds present. No organomegaly or mass.  EXTREMITIES: No pedal edema, cyanosis, or clubbing.  NEUROLOGIC: Cranial nerves II through XII are intact. No focal motor or sensory defecits b/l. Globally weak and tremulous and unsteady when attempting to ambulate.  PSYCHIATRIC: The patient is alert and oriented x 3.   SKIN: No obvious rash, lesion, or ulcer.   DATA REVIEW:   CBC Recent Labs  Lab 06/22/18 1012  WBC 13.1*  HGB 14.3  HCT 41.1  PLT 221    Chemistries  Recent Labs  Lab 06/21/18 2322  06/25/18 0450  NA 136   < > 138  K 2.7*   < > 3.7  CL 96*   < > 102  CO2 27   < > 25  GLUCOSE 103*   < > 98  BUN 13   < > 14  CREATININE 0.84   < > 0.72  CALCIUM 8.0*   < > 8.9   MG 1.5*   < > 1.7  AST 27  --   --   ALT 15  --   --   ALKPHOS 51  --   --   BILITOT 1.1  --   --    < > = values in this interval not displayed.    Cardiac Enzymes No results for input(s): TROPONINI in the last 168 hours.  Microbiology Results  Results for orders placed or performed during the hospital encounter of 06/17/18  MRSA PCR Screening     Status: Abnormal   Collection Time: 06/17/18  7:09 PM  Result Value Ref Range Status   MRSA by PCR POSITIVE (A) NEGATIVE Final    Comment:        The GeneXpert MRSA Assay (FDA approved for NASAL specimens only), is one component of a comprehensive MRSA colonization surveillance program. It is not intended to diagnose MRSA infection nor to guide or monitor treatment for MRSA infections. CRITICAL RESULT CALLED TO, READ BACK BY AND VERIFIED WITH: C/ MISTY CAUSY @2200  06/17/18 [facility] Performed at [facility], [address] [place], [place] [address]   CULTURE, BLOOD (ROUTINE X 2) w Reflex to ID Panel     Status: None   Collection Time: 06/19/18  2:39 PM  Result Value Ref Range Status   Specimen Description BLOOD LT [facility]  Final   Special Requests   Final    BOTTLES DRAWN AEROBIC AND ANAEROBIC Blood Culture adequate volume   Culture   Final    NO GROWTH 5 DAYS Performed at [facility], [address] [place], [place] [address]    Report Status 06/24/2018 FINAL  Final  CULTURE, BLOOD (ROUTINE X 2) w Reflex to ID Panel     Status: None   Collection Time: 06/19/18  2:39 PM  Result Value Ref Range Status   Specimen Description BLOOD RT HAND  Final   Special Requests   Final    BOTTLES DRAWN AEROBIC AND ANAEROBIC Blood Culture adequate volume   Culture   Final    NO GROWTH 5 DAYS Performed at [facility], [address], [place], [place] [address]    Report Status 06/24/2018 FINAL  Final  Culture, respiratory (non-expectorated)     Status: None   Collection Time: 06/19/18  5:57 PM  Result Value Ref  Range Status   Specimen Description   Final    TRACHEAL ASPIRATE Performed at [facility], [address], [place], [place] [address]  Special Requests   Final    NONE Performed at [facility], [address] [place], [place] [address]    Gram Stain   Final    FEW WBC PRESENT,BOTH PMN AND MONONUCLEAR MODERATE GRAM NEGATIVE RODS FEW GRAM POSITIVE COCCI IN PAIRS RARE SQUAMOUS EPITHELIAL CELLS PRESENT Performed at [facility], [address] [place], [place] [address]    Culture FEW ENTEROBACTER CLOACAE  Final   Report Status 06/21/2018 FINAL  Final   Organism ID, Bacteria ENTEROBACTER CLOACAE  Final      Susceptibility   Enterobacter cloacae - MIC*    CEFAZOLIN >=64 RESISTANT Resistant     CEFEPIME <=1 SENSITIVE Sensitive     CEFTAZIDIME <=1 SENSITIVE Sensitive     CEFTRIAXONE <=1 SENSITIVE Sensitive     CIPROFLOXACIN <=0.25 SENSITIVE Sensitive     GENTAMICIN <=1 SENSITIVE Sensitive     IMIPENEM <=0.25 SENSITIVE Sensitive     TRIMETH/SULFA <=20 SENSITIVE Sensitive     PIP/TAZO <=4 SENSITIVE Sensitive     * FEW ENTEROBACTER CLOACAE    RADIOLOGY:  No results found.    Management plans discussed with the patient, family and they are in agreement.  CODE STATUS:     Code Status Orders  (From admission, onward)         Start     Ordered   06/17/18 1913  Full code  Continuous     06/17/18 1912        Code Status History    This patient has a current code status but no historical code status.      TOTAL TIME TAKING CARE OF THIS PATIENT: 40 minutes.    Houston SirenSAINANI,VIVEK J M.D on 06/25/2018 at 10:55 AM  Between 7am to 6pm - Pager - [phone]  After 6pm go to [email or web address] - Scientist, research (life sciences)password [email or web address]  [facility] [facility]  Office  [phone]  CC: Primary care physician; Titus MouldWhite, Elizabeth Burney, NP

## 2018-06-25 NOTE — Progress Notes (Signed)
Report called to towanda at [facility]

## 2020-06-11 IMAGING — DX DG CHEST 1V PORT
1 series · 1 of 1 positions shown · non-contrast
Comparison: Chest x-ray from yesterday.

CLINICAL DATA: Pneumonia.

EXAM:
PORTABLE CHEST 1 VIEW

[chest ap]
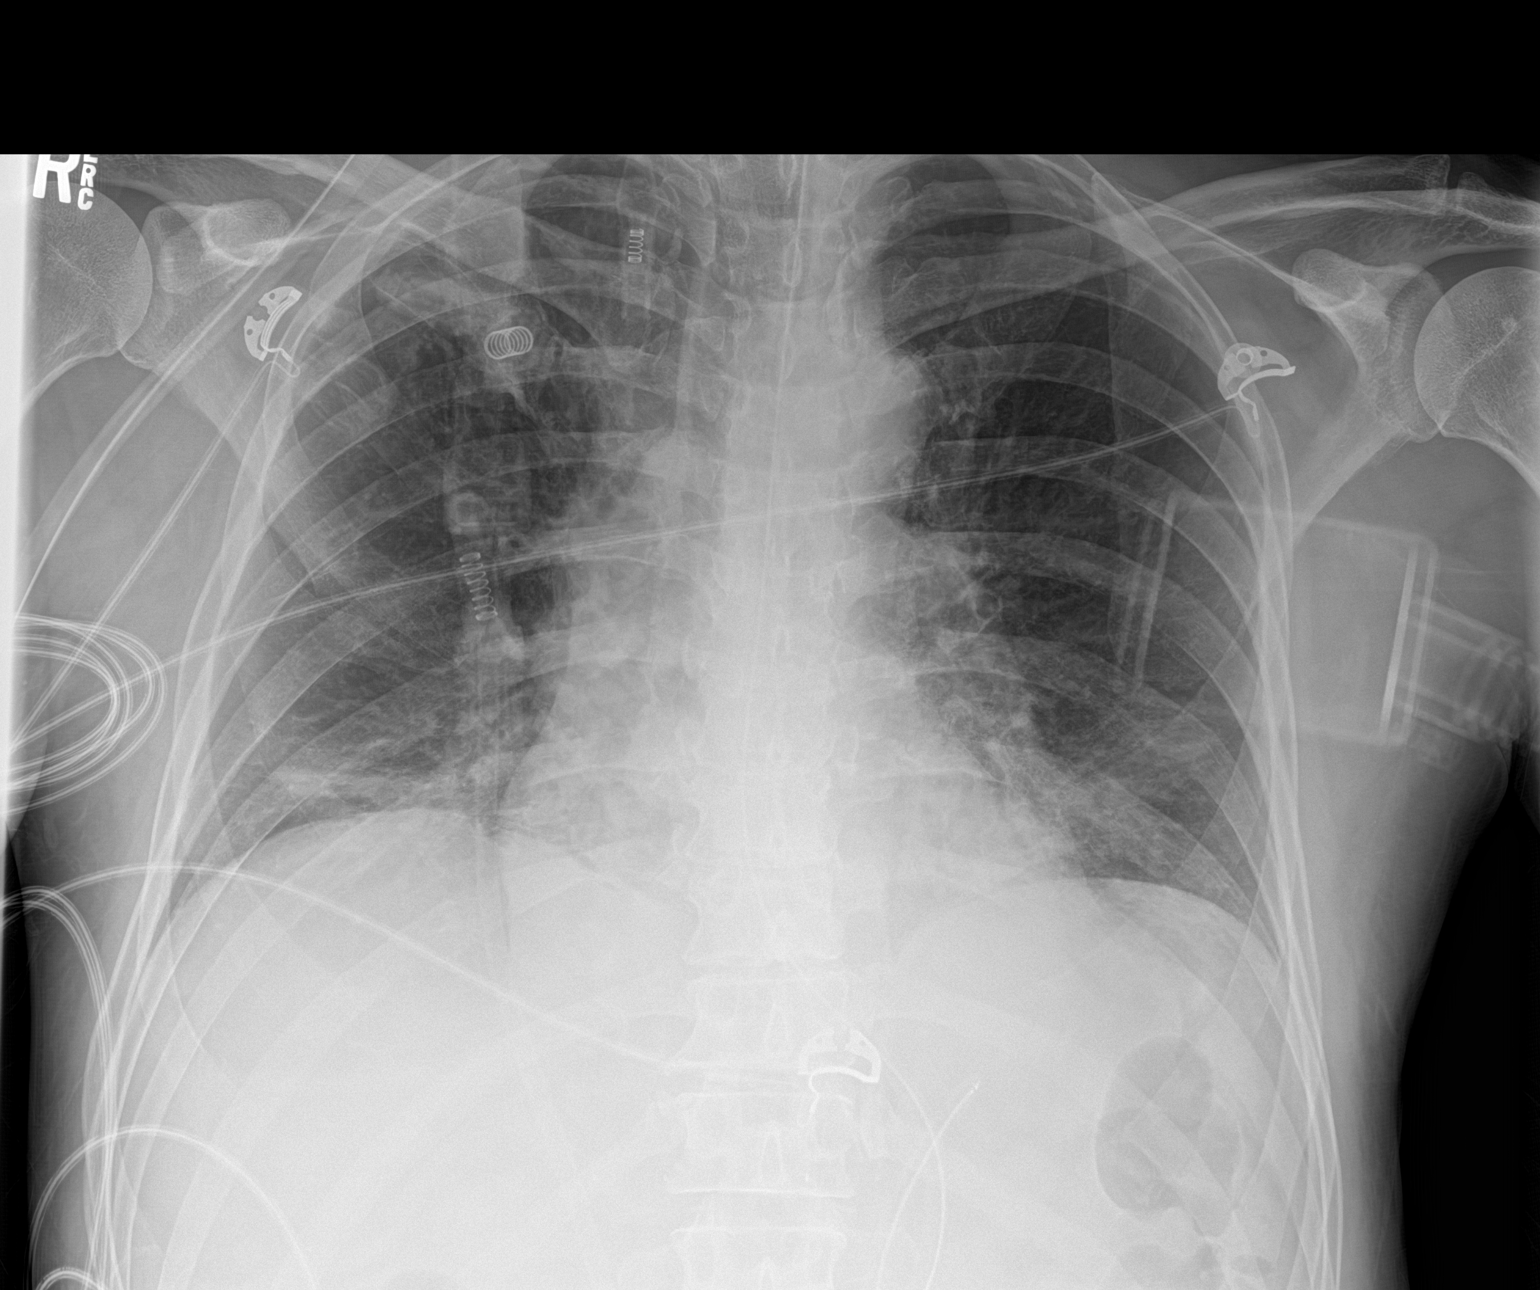

[1 of 1 positions shown; findings below may reference images not displayed]

FINDINGS: Unchanged positioning of the endotracheal tube tip 8.9 cm above the
carina. Unchanged enteric tube in the stomach. The heart size and
mediastinal contours are within normal limits. Normal pulmonary
vascularity. Increasing linear atelectasis at the right lung base.
Unchanged mild left basilar airspace disease. No pleural effusion or
pneumothorax. No acute osseous abnormality.
IMPRESSION: 1. Unchanged positioning of the endotracheal tube tip 8.9 cm above
the carina. Recommend advancement 3-4 cm.
2. Increasing linear atelectasis at the right lung base. Unchanged
mild left basilar airspace disease.

## 2020-06-12 IMAGING — DX DG CHEST 1V PORT
1 series · 1 of 1 positions shown · non-contrast
Comparison: June 20, 2018

CLINICAL DATA: Pneumonia.

EXAM:
PORTABLE CHEST 1 VIEW

[chest ap]
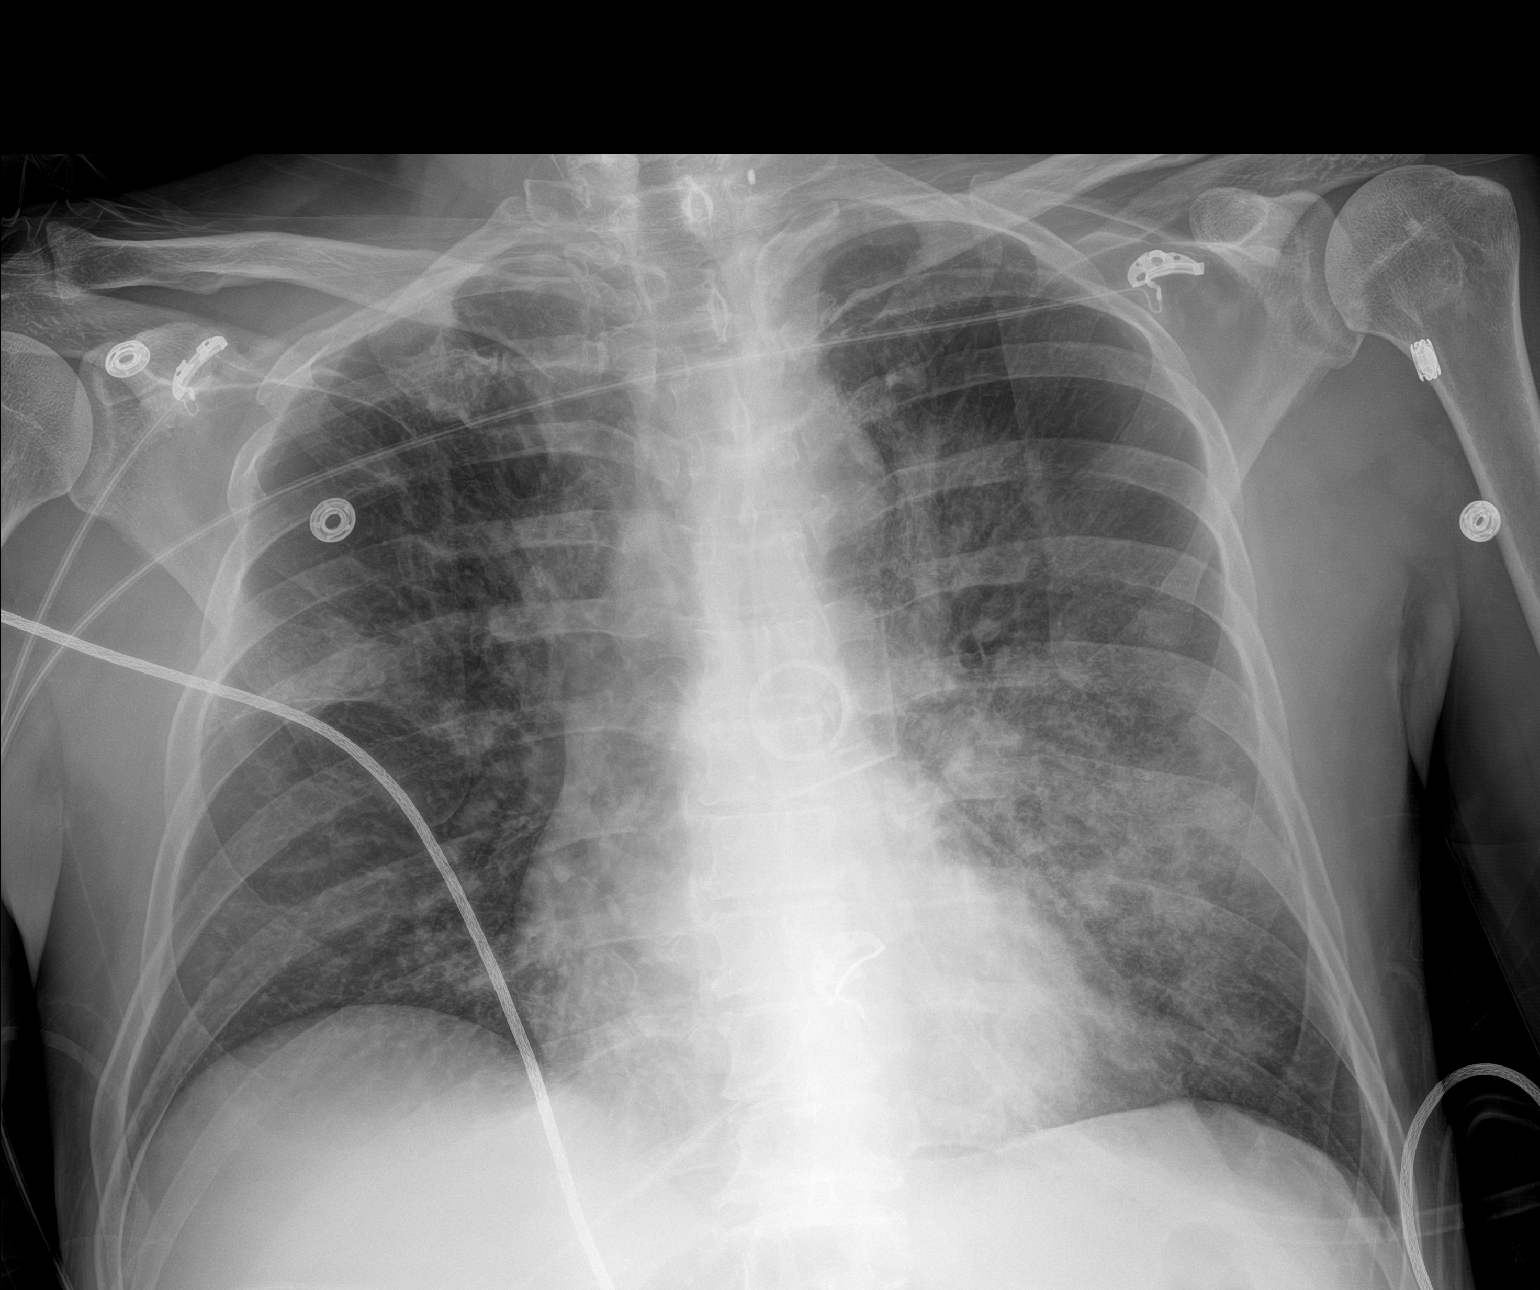

[1 of 1 positions shown; findings below may reference images not displayed]

FINDINGS: No pneumothorax. The ET and NG tubes have been removed. New opacity
in the left perihilar region. Mild focal opacity in the right mid
lung. The heart, hila, and mediastinum are unchanged. A rounded
density over the chest is likely on the patient, new in the
interval. No other acute abnormalities.
IMPRESSION: Bilateral pulmonary infiltrates, left greater than right, suspicious
for multifocal pneumonia. Recommend follow-up to resolution.
